# Patient Record
Sex: Male | Born: 1980 | Race: White | Hispanic: No | Marital: Married | Smoking: Never smoker
Health system: Southern US, Community
[De-identification: ages and names within clinical notes are randomized; demographics above are authoritative.]

## PROBLEM LIST (undated history)

## (undated) DIAGNOSIS — R062 Wheezing: Secondary | ICD-10-CM

## (undated) DIAGNOSIS — K219 Gastro-esophageal reflux disease without esophagitis: Secondary | ICD-10-CM

## (undated) DIAGNOSIS — M79602 Pain in left arm: Secondary | ICD-10-CM

## (undated) DIAGNOSIS — T7840XA Allergy, unspecified, initial encounter: Secondary | ICD-10-CM

## (undated) DIAGNOSIS — F419 Anxiety disorder, unspecified: Secondary | ICD-10-CM

## (undated) HISTORY — DX: Pain in left arm: M79.602

## (undated) HISTORY — DX: Wheezing: R06.2

## (undated) HISTORY — DX: Gastro-esophageal reflux disease without esophagitis: K21.9

## (undated) HISTORY — DX: Anxiety disorder, unspecified: F41.9

## (undated) HISTORY — DX: Allergy, unspecified, initial encounter: T78.40XA

## (undated) HISTORY — PX: NASAL SEPTUM SURGERY: SHX37

---

## 2005-09-23 ENCOUNTER — Emergency Department: Payer: Self-pay | Admitting: Internal Medicine

## 2005-10-07 ENCOUNTER — Emergency Department: Payer: Self-pay | Admitting: Emergency Medicine

## 2009-03-09 ENCOUNTER — Ambulatory Visit: Payer: Self-pay | Admitting: Otolaryngology

## 2015-03-23 ENCOUNTER — Encounter: Payer: Self-pay | Admitting: Family Medicine

## 2015-03-23 ENCOUNTER — Ambulatory Visit (INDEPENDENT_AMBULATORY_CARE_PROVIDER_SITE_OTHER): Payer: Managed Care, Other (non HMO) | Admitting: Family Medicine

## 2015-03-23 VITALS — BP 112/74 | HR 86 | Temp 98.1°F | Resp 18 | Ht 74.0 in | Wt 172.5 lb

## 2015-03-23 DIAGNOSIS — M25531 Pain in right wrist: Secondary | ICD-10-CM

## 2015-03-23 DIAGNOSIS — R062 Wheezing: Secondary | ICD-10-CM | POA: Diagnosis not present

## 2015-03-23 DIAGNOSIS — F429 Obsessive-compulsive disorder, unspecified: Secondary | ICD-10-CM

## 2015-03-23 DIAGNOSIS — K219 Gastro-esophageal reflux disease without esophagitis: Secondary | ICD-10-CM | POA: Diagnosis not present

## 2015-03-23 DIAGNOSIS — R1013 Epigastric pain: Secondary | ICD-10-CM

## 2015-03-23 DIAGNOSIS — R634 Abnormal weight loss: Secondary | ICD-10-CM

## 2015-03-23 DIAGNOSIS — Z23 Encounter for immunization: Secondary | ICD-10-CM | POA: Diagnosis not present

## 2015-03-23 DIAGNOSIS — M79641 Pain in right hand: Secondary | ICD-10-CM | POA: Diagnosis not present

## 2015-03-23 MED ORDER — OMEPRAZOLE 40 MG PO CPDR: 40.0000 mg | DELAYED_RELEASE_CAPSULE | Freq: Every day | ORAL | Status: DC

## 2015-03-23 MED ORDER — ALBUTEROL SULFATE HFA 108 (90 BASE) MCG/ACT IN AERS: 2.0000 | INHALATION_SPRAY | Freq: Four times a day (QID) | RESPIRATORY_TRACT | Status: DC | PRN

## 2015-03-23 MED ORDER — SERTRALINE HCL 50 MG PO TABS
50.0000 mg | ORAL_TABLET | Freq: Every day | ORAL | Status: DC
Start: 2015-03-23 — End: 2015-04-12

## 2015-03-23 NOTE — Progress Notes (Signed)
Name: Ronald Hale   MRN: [id number]    DOB: [birth year]   Date:03/23/2015       Progress Note  Subjective  Chief Complaint  Chief Complaint  Patient presents with  . Abdominal Pain    onset 2 months burning sensation sometimes after he eats, patient states comes and goes  . Hand Pain    onset 1 month right hand radiates to wrist, states cannot lift anything heavy and also having tingling and numbness    HPI  Epigastric pain : he has a long history of gerd, usually controlled with OTC Tums and avoiding sodas and changing his diet, however over the past couple of months, having recurrent epigastric pain, most days of the week, described as burning sensation, sometimes sharp. Pain does not wake him up at night, no blood in stools, no black stools. He denies nausea or vomiting. He has noticed some regurgitation and about 6-8 lbs weight loss over the past few months.   Right hand and Wrist pain: he was in a kick boxing class last week and he felt immediate pain after punching the bag. He had to stop activity. Pain has been constant, feeling stiff on his wrist and 4th and 5th finger are really sore. Difficulty picking up anything, cannot even use a knife to cut his food last night. Pain is described as aching, sharp and sometimes tingling. Pain at rest is about 5/10 but can go up to 10/10.   OCD: he states he had symptoms years ago, noticed when living with his grandmother, before he got married, but wife is worried because symptoms are getting progressively worse. Fidgety, unable to relax, pacing, afraid to go outside. Needs to wash hands constantly, also asks wife to shower before bed and wash her hands. He is a germophobe. Also afraid to go outdoors because of fear of getting exposed to poison oak.   Wheezing: sob and wheezing the first 15 minutes of activity. Usually gets worse after that. No cough, no chest tightness. No symptoms when not exercising.   Patient Active Problem List    Diagnosis Date Noted  . GERD without esophagitis 03/23/2015  . Wheezing 03/23/2015  . OCD (obsessive compulsive disorder) 03/23/2015  . Allergic rhinitis 08/23/2008    History reviewed. No pertinent past surgical history.  History reviewed. No pertinent family history.  Social History   Social History  . Marital Status: Single    Spouse Name: N/A  . Number of Children: N/A  . Years of Education: N/A   Occupational History  . Not on file.   Social History Main Topics  . Smoking status: Never Smoker   . Smokeless tobacco: Never Used  . Alcohol Use: No  . Drug Use: No  . Sexual Activity: Yes   Other Topics Concern  . Not on file   Social History Narrative  . No narrative on file     Current outpatient prescriptions:  .  omeprazole (PRILOSEC) 40 MG capsule, Take 1 capsule (40 mg total) by mouth daily., Disp: 30 capsule, Rfl: 0 .  sertraline (ZOLOFT) 50 MG tablet, Take 1 tablet (50 mg total) by mouth daily. Start with half pill for one week and after that increase to one pill daily, Disp: 30 tablet, Rfl: 3  No Known Allergies   ROS  Ten systems reviewed and is negative except as mentioned in HPI   Objective  Filed Vitals:   03/23/15 1055  BP: 112/74  Pulse: 86  Temp: 98.1 F (  36.7 C)  TempSrc: Oral  Resp: 18  Height: 6\' 2"  (1.88 m)  Weight: 172 lb 8 oz (78.245 kg)  SpO2: 97%    Body mass index is 22.14 kg/(m^2).  Physical Exam  Constitutional: Patient appears well-developed and well-nourished. No distress.  HEENT: head atraumatic, normocephalic, pupils equal and reactive to light, neck supple, throat within normal limits Cardiovascular: Normal rate, regular rhythm and normal heart sounds.  No murmur heard. No BLE edema. Pulmonary/Chest: Effort normal and breath sounds normal. No respiratory distress. Abdominal: Soft.  There is no tenderness. Psychiatric: Patient has a normal mood and affect. behavior is normal. Judgment and thought content  normal. Muscular skeletal: mild bruising over 5th and 4th MCP area, very tender during palpation Metacarpals 4th and 5th  and right wrist, difficulty with grip  PHQ2/9: Depression screen PHQ 2/9 03/23/2015  Decreased Interest 0  Down, Depressed, Hopeless 0  PHQ - 2 Score 0    Fall Risk: Fall Risk  03/23/2015  Falls in the past year? No    Functional Status Survey: Is the patient deaf or have difficulty hearing?: No Does the patient have difficulty seeing, even when wearing glasses/contacts?: No Does the patient have difficulty concentrating, remembering, or making decisions?: No Does the patient have difficulty walking or climbing stairs?: No Does the patient have difficulty dressing or bathing?: No Does the patient have difficulty doing errands alone such as visiting a doctor's office or shopping?: No    Assessment & Plan  1. Epigastric pain  - CBC with Differential/Platelet - H. pylori antibody, IgG  2. Needs flu shot  - Flu Vaccine QUAD 36+ mos PF IM (Fluarix & Fluzone Quad PF)  3. 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   4. GERD without esophagitis  - omeprazole (PRILOSEC) 40 MG capsule; Take 1 capsule (40 mg total) by mouth daily.  Dispense: 30 capsule; Refill: 0  5. Right wrist pain  - Ambulatory referral to Orthopedic Surgery  6. Right hand pain  - Ambulatory referral to Orthopedic Surgery  7. Weight loss  We will check labs and monitor for now  8. OCD (obsessive compulsive disorder)  Discussed counseling but he wants to hold off for now - sertraline (ZOLOFT) 50 MG tablet; Take 1 tablet (50 mg total) by mouth daily. Start with half pill for one week and after that increase to one pill daily  Dispense: 30 tablet; Refill: 3

## 2015-03-24 ENCOUNTER — Other Ambulatory Visit: Payer: Self-pay | Admitting: Family Medicine

## 2015-03-24 LAB — CBC WITH DIFFERENTIAL/PLATELET
BASOS ABS: 0 10*3/uL (ref 0.0–0.2)
Basos: 0 %
EOS (ABSOLUTE): 0 10*3/uL (ref 0.0–0.4)
Eos: 0 %
Hematocrit: 44.1 % (ref 37.5–51.0)
Hemoglobin: 15.2 g/dL (ref 12.6–17.7)
IMMATURE GRANULOCYTES: 0 %
Immature Grans (Abs): 0 10*3/uL (ref 0.0–0.1)
LYMPHS ABS: 1.6 10*3/uL (ref 0.7–3.1)
Lymphs: 29 %
MCH: 30.7 pg (ref 26.6–33.0)
MCHC: 34.5 g/dL (ref 31.5–35.7)
MCV: 89 fL (ref 79–97)
MONOS ABS: 0.4 10*3/uL (ref 0.1–0.9)
Monocytes: 8 %
NEUTROS PCT: 63 %
Neutrophils Absolute: 3.3 10*3/uL (ref 1.4–7.0)
PLATELETS: 199 10*3/uL (ref 150–379)
RBC: 4.95 x10E6/uL (ref 4.14–5.80)
RDW: 13.1 % (ref 12.3–15.4)
WBC: 5.3 10*3/uL (ref 3.4–10.8)

## 2015-03-24 LAB — H. PYLORI ANTIBODY, IGG: H PYLORI IGG: 3.1 U/mL — AB (ref 0.0–0.8)

## 2015-03-24 MED ORDER — AMOXICILL-CLARITHRO-LANSOPRAZ PO MISC: Freq: Two times a day (BID) | ORAL | Status: DC

## 2015-04-12 ENCOUNTER — Encounter: Payer: Self-pay | Admitting: Family Medicine

## 2015-04-12 ENCOUNTER — Ambulatory Visit (INDEPENDENT_AMBULATORY_CARE_PROVIDER_SITE_OTHER): Payer: Managed Care, Other (non HMO) | Admitting: Family Medicine

## 2015-04-12 VITALS — BP 122/82 | HR 81 | Temp 98.0°F | Resp 18 | Ht 74.0 in | Wt 166.7 lb

## 2015-04-12 DIAGNOSIS — B9681 Helicobacter pylori [H. pylori] as the cause of diseases classified elsewhere: Secondary | ICD-10-CM

## 2015-04-12 DIAGNOSIS — F321 Major depressive disorder, single episode, moderate: Secondary | ICD-10-CM | POA: Diagnosis not present

## 2015-04-12 DIAGNOSIS — R062 Wheezing: Secondary | ICD-10-CM

## 2015-04-12 DIAGNOSIS — F429 Obsessive-compulsive disorder, unspecified: Secondary | ICD-10-CM | POA: Diagnosis not present

## 2015-04-12 DIAGNOSIS — A048 Other specified bacterial intestinal infections: Secondary | ICD-10-CM

## 2015-04-12 MED ORDER — ALBUTEROL SULFATE HFA 108 (90 BASE) MCG/ACT IN AERS: 2.0000 | INHALATION_SPRAY | Freq: Four times a day (QID) | RESPIRATORY_TRACT | Status: DC | PRN

## 2015-04-12 NOTE — Progress Notes (Signed)
Name: Ronald Hale   MRN: [id number]    DOB: 11-21-1980   Date:04/12/2015       Progress Note  Subjective  Chief Complaint  Chief Complaint  Patient presents with  . Medication Management    1 month F/U  . OCD    Started patient on Zoloft last visit and was having bad side effects such as mood swings  . Abdominal Pain    Started patient on Omeprazole but symptoms are unchanged.   . Wheezing    Unchanged gets sob with exertion    HPI  H. Pylori infection  : he has a long history of gerd, usually controlled with OTC Tums and avoiding sodas and changing his diet, however over the past couple of months, having recurrent epigastric pain, most days of the week, described as burning sensation, sometimes sharp. Pain does not wake him up at night, no blood in stools, no black stools. He denies nausea or vomiting. He has noticed some regurgitation and about 20  lbs weight loss over the past few months. He was diagnosed with h. Pylori but has not gotten prescription filled yet.  Right hand and Wrist pain: he was in a kick boxing class, seen by ortho, he is wearing a brace and is also taking Meloxicam but advised to take nsaid's and take Tylenol at this time  OCD: he states he had symptoms years ago, noticed when living with his grandmother, before he got married, but wife is worried because symptoms are getting progressively worse. Fidgety, unable to relax, pacing, afraid to go outside. Needs to wash hands constantly, also asks wife to shower before bed and wash her hands. He is a germophobe. Also afraid to go outdoors because of fear of getting exposed to poison oak.   Wheezing: sob and wheezing the first 15 minutes of activity. Usually gets worse after that. No cough, no chest tightness. No symptoms when not exercising. Spirometry today was normal, but since it is related to activity we will add Proair and see how he does.   Major Depression: his wife found out he was cheating on her on Nov  23rd,2016 , with a co-worker. He ha lost weight since, very stressed. The relationship ended, but his wife has not forgiven him yet, they argue all the time now, go to bed late, unable to unwind and sleep. His ability to focus has decreased. He tried Zoloft but made him get very angry and agitated, he had to stop medication 3 days ago because it was getting so intense. He also states that he got a gun to his mouth to attempt suicide on the 23 rd of December. He denies suicidal thoughts. He locked up the shot gun in her garage. He does not have the garage door opener.   Patient Active Problem List   Diagnosis Date Noted  . H. pylori infection 04/12/2015  . Wheezing 03/23/2015  . OCD (obsessive compulsive disorder) 03/23/2015  . Allergic rhinitis 08/23/2008    History reviewed. No pertinent past surgical history.  History reviewed. No pertinent family history.  Social History   Social History  . Marital Status: Single    Spouse Name: N/A  . Number of Children: N/A  . Years of Education: N/A   Occupational History  . Not on file.   Social History Main Topics  . Smoking status: Never Smoker   . Smokeless tobacco: Never Used  . Alcohol Use: No  . Drug Use: No  . Sexual Activity:  Yes   Other Topics Concern  . Not on file   Social History Narrative     Current outpatient prescriptions:  .  amoxicillin-clarithromycin-lansoprazole (PREVPAC) combo pack, Take by mouth 2 (two) times daily. Follow package directions., Disp: 1 kit, Rfl: 0 .  albuterol (PROVENTIL HFA;VENTOLIN HFA) 108 (90 Base) MCG/ACT inhaler, Inhale 2 puffs into the lungs every 6 (six) hours as needed for wheezing or shortness of breath., Disp: 1 Inhaler, Rfl: 0  No Known Allergies   ROS  Constitutional: Negative for fever , positive for weight change.  Respiratory: Negative for cough and shortness of breath.   Cardiovascular: Negative for chest pain ( when very upset and with activity ) or palpitations.   Gastrointestinal: Negative for abdominal pain, no bowel changes.  Musculoskeletal: Negative for gait problem or joint swelling.  Skin: Negative for rash.  Neurological: Negative for dizziness or headache.  No other specific complaints in a complete review of systems (except as listed in HPI above).  Objective  Filed Vitals:   04/12/15 0903  BP: 122/82  Pulse: 81  Temp: 98 F (36.7 C)  TempSrc: Oral  Resp: 18  Height: '6\' 2"'$  (1.88 m)  Weight: 166 lb 11.2 oz (75.615 kg)  SpO2: 98%    Body mass index is 21.39 kg/(m^2).  Physical Exam  Constitutional: Patient appears well-developed and well-nourished. No distress.  HEENT: head atraumatic, normocephalic, pupils equal and reactive to light, neck supple, throat within normal limits Cardiovascular: Normal rate, regular rhythm and normal heart sounds.  No murmur heard. No BLE edema. Pulmonary/Chest: Effort normal and breath sounds normal. No respiratory distress. Abdominal: Soft.  There is tenderness during palpation of epigastric area  Psychiatric: Patient has a normal mood and affect. behavior is normal. Judgment and thought content normal.  Recent Results (from the past 2160 hour(s))  CBC with Differential/Platelet     Status: None   Collection Time: 03/23/15 12:54 PM  Result Value Ref Range   WBC 5.3 3.4 - 10.8 x10E3/uL   RBC 4.95 4.14 - 5.80 x10E6/uL   Hemoglobin 15.2 12.6 - 17.7 g/dL   Hematocrit 44.1 37.5 - 51.0 %   MCV 89 79 - 97 fL   MCH 30.7 26.6 - 33.0 pg   MCHC 34.5 31.5 - 35.7 g/dL   RDW 13.1 12.3 - 15.4 %   Platelets 199 150 - 379 x10E3/uL   Neutrophils 63 %   Lymphs 29 %   Monocytes 8 %   Eos 0 %   Basos 0 %   Neutrophils Absolute 3.3 1.4 - 7.0 x10E3/uL   Lymphocytes Absolute 1.6 0.7 - 3.1 x10E3/uL   Monocytes Absolute 0.4 0.1 - 0.9 x10E3/uL   EOS (ABSOLUTE) 0.0 0.0 - 0.4 x10E3/uL   Basophils Absolute 0.0 0.0 - 0.2 x10E3/uL   Immature Granulocytes 0 %   Immature Grans (Abs) 0.0 0.0 - 0.1 x10E3/uL  H.  pylori antibody, IgG     Status: Abnormal   Collection Time: 03/23/15 12:54 PM  Result Value Ref Range   H Pylori IgG 3.1 (H) 0.0 - 0.8 U/mL    Comment:                              Negative            <0.9  Indeterminate  0.9 - 1.0                              Positive            >1.0      PHQ2/9: Depression screen [facility] 2/9 03/23/2015  Decreased Interest 0  Down, Depressed, Hopeless 0  PHQ - 2 Score 0    Fall Risk: Fall Risk  03/23/2015  Falls in the past year? No    Assessment & Plan  1. H. pylori infection  Get rx filled at the pharmacy  2. OCD (obsessive compulsive disorder)  - Ambulatory referral to Psychiatry  3. Wheezing  Normal spirometry, first test without activity and second after activity and it improved, try Albuterol prior to activity  - albuterol (PROVENTIL HFA;VENTOLIN HFA) 108 (90 Base) MCG/ACT inhaler; Inhale 2 puffs into the lungs every 6 (six) hours as needed for wheezing or shortness of breath.  Dispense: 1 Inhaler; Refill: 0  4. Major depressive disorder, single episode, moderate ([place])  - Ambulatory referral to Psychiatry Gave the patient the suicide hot line

## 2015-04-12 NOTE — Addendum Note (Signed)
Addended by: Vonna Kotyk L on: 04/12/2015 10:03 AM   Modules accepted: Orders

## 2015-04-17 ENCOUNTER — Ambulatory Visit: Payer: Managed Care, Other (non HMO) | Admitting: Licensed Clinical Social Worker

## 2015-04-24 ENCOUNTER — Ambulatory Visit (INDEPENDENT_AMBULATORY_CARE_PROVIDER_SITE_OTHER): Payer: Managed Care, Other (non HMO) | Admitting: Licensed Clinical Social Worker

## 2015-04-24 DIAGNOSIS — F411 Generalized anxiety disorder: Secondary | ICD-10-CM | POA: Diagnosis not present

## 2015-04-24 NOTE — Progress Notes (Signed)
Comprehensive Clinical Assessment (CCA) Note  04/24/2015 Ronald Hale [id number]  Visit Diagnosis:      ICD-9-CM ICD-10-CM   1. GAD (generalized anxiety disorder) 300.02 F41.1       CCA Part One  Part One has been completed on paper by the patient.  (See scanned document in Chart Review)  CCA Part Two A  Intake/Chief Complaint:  CCA Intake With Chief Complaint CCA Part Two Date: 04/24/15 CCA Part Two Time: 36 Chief Complaint/Presenting Problem: referred by PCP Dr. Gwynneth Aliment; possible OCD Patients Currently Reported Symptoms/Problems: poor appetite, restless sleep, jittery, can't sit still, pacing, cleaning,  Individual's Strengths: putting things together, numbers, math, financial stuff, organized, responsible, problems solver, sense of humor  Mental Health Symptoms Depression:  Depression: Change in energy/activity, Increase/decrease in appetite, Irritability, Weight gain/loss  Mania:     Anxiety:   Anxiety: Difficulty concentrating, Fatigue, Irritability, Restlessness, Sleep, Tension, Worrying  Psychosis:  Psychosis: N/A  Trauma:  Trauma: N/A  Obsessions:  Obsessions: N/A  Compulsions:  Compulsions: N/A  Inattention:  Inattention: N/A  Hyperactivity/Impulsivity:  Hyperactivity/Impulsivity: N/A  Oppositional/Defiant Behaviors:  Oppositional/Defiant Behaviors: N/A  Borderline Personality:  Emotional Irregularity: N/A  Other Mood/Personality Symptoms:      Mental Status Exam Appearance and self-care  Stature:  Stature: Tall  Weight:  Weight: Underweight  Clothing:  Clothing: Casual  Grooming:  Grooming: Normal  Cosmetic use:  Cosmetic Use: Age appropriate  Posture/gait:  Posture/Gait: Normal  Motor activity:  Motor Activity: Not Remarkable  Sensorium  Attention:  Attention: Normal  Concentration:  Concentration: Normal  Orientation:  Orientation: X5  Recall/memory:  Recall/Memory: Normal  Affect and Mood  Affect:  Affect: Appropriate  Mood:  Mood: Anxious   Relating  Eye contact:  Eye Contact: Normal  Facial expression:  Facial Expression: Anxious  Attitude toward examiner:  Attitude Toward Examiner: Cooperative  Thought and Language  Speech flow: Speech Flow: Normal  Thought content:     Preoccupation:     Hallucinations:     Organization:     Transport planner of Knowledge:  Fund of Knowledge: Average  Intelligence:  Intelligence: Average  Abstraction:  Abstraction: Normal  Judgement:  Judgement: Normal  Reality Testing:  Reality Testing: Adequate  Insight:  Insight: Good  Decision Making:  Decision Making: Normal  Social Functioning  Social Maturity:  Social Maturity: Responsible  Social Judgement:  Social Judgement: Normal  Stress  Stressors:     Coping Ability:     Skill Deficits:     Supports:      Family and Psychosocial History: Family history Marital status: Married Number of Years Married: 4 What types of issues is patient dealing with in the relationship?: positive Are you sexually active?: Yes What is your sexual orientation?: heterosexual Does patient have children?: No  Childhood History:  Childhood History By whom was/is the patient raised?: Grandparents Additional childhood history information: Born in [place], describes childhood as : happy, no problems Description of patient's relationship with caregiver when they were a child: Grandmother:positive relationship; grandfather currently deceased positive relationship while growing up How were you disciplined when you got in trouble as a child/adolescent?: "I was rarely disciplined as a child." Does patient have siblings?: Yes Number of Siblings: 1 Description of patient's current relationship with siblings: no relationship Did patient suffer any verbal/emotional/physical/sexual abuse as a child?: No Did patient suffer from severe childhood neglect?: No Has patient ever been sexually abused/assaulted/raped as an adolescent or adult?: No Was the  patient ever  a victim of a crime or a disaster?: No Witnessed domestic violence?: No Has patient been effected by domestic violence as an adult?: No  CCA Part Two B  Employment/Work Situation: Employment / Work Copywriter, advertising Employment situation: Employed Where is patient currently employed?: [employer] How long has patient been employed?: 3 years Patient's job has been impacted by current illness: Yes Describe how patient's job has been impacted: Believes that his job is impacting his current life What is the longest time patient has a held a job?: 3 years Where was the patient employed at that time?: [employer] Has patient ever been in the [employer]?: No  Education: Education Last Grade Completed: 14 Name of [place]: [place] Did Teacher, adult education From [employer]?: Yes Did Physicist, medical?: Yes What Type of College Degree Do you Have?: [employer] Did You Attend Graduate School?: No What Was Your Major?: [employer] Did You Have An Individualized Education Program (IIEP): No Did You Have Any Difficulty At [employer]?: No  Religion: Religion/Spirituality Are You A Religious Person?: No  Leisure/Recreation: Leisure / Recreation Leisure and Hobbies: anything with sports; basketball  Exercise/Diet: Exercise/Diet Do You Exercise?: No Have You Gained or Lost A Significant Amount of Weight in the Past Six Months?: Yes-Lost Number of Pounds Lost?: 20 Do You Follow a Special Diet?: No Do You Have Any Trouble Sleeping?: No  CCA Part Two C  Alcohol/Drug Use: Alcohol / Drug Use Pain Medications: n/a Prescriptions: Prevpac Over the Counter: benadryl (allergies) History of alcohol / drug use?: No history of alcohol / drug abuse                      CCA Part Three  ASAM's:  Six Dimensions of Multidimensional Assessment  Dimension 1:  Acute Intoxication and/or Withdrawal Potential:     Dimension 2:  Biomedical Conditions and Complications:     Dimension 3:   Emotional, Behavioral, or Cognitive Conditions and Complications:     Dimension 4:  Readiness to Change:     Dimension 5:  Relapse, Continued use, or Continued Problem Potential:     Dimension 6:  Recovery/Living Environment:      Substance use Disorder (SUD)    Social Function:  Social Functioning Social Maturity: Responsible Social Judgement: Normal  Stress:     Risk Assessment- Self-Harm Potential: Risk Assessment For Self-Harm Potential Thoughts of Self-Harm: No current thoughts Method: No plan Availability of Means: No access/NA Additional Comments for Self-Harm Potential: about a month ago; one thought of suicide while on Zoloft.    Risk Assessment -Dangerous to Others Potential: Risk Assessment For Dangerous to Others Potential Method: No Plan Availability of Means: No access or NA Intent: Vague intent or NA Notification Required: No need or identified person  DSM5 Diagnoses: Patient Active Problem List   Diagnosis Date Noted  . H. pylori infection 04/12/2015  . Wheezing 03/23/2015  . OCD (obsessive compulsive disorder) 03/23/2015  . Allergic rhinitis 08/23/2008    Patient Centered Plan: Patient is on the following Treatment Plan(s):  Anxiety  Recommendations for Services/Supports/Treatments: Recommendations for Services/Supports/Treatments Recommendations For Services/Supports/Treatments: Medication Management, Individual Therapy  Treatment Plan Summary:    Referrals to Alternative Service(s): Referred to Alternative Service(s):   Place:   Date:   Time:    Referred to Alternative Service(s):   Place:   Date:   Time:    Referred to Alternative Service(s):   Place:   Date:   Time:    Referred to Alternative  Service(s):   Place:   Date:   Time:     [place]

## 2015-04-25 ENCOUNTER — Ambulatory Visit: Payer: Managed Care, Other (non HMO) | Admitting: Family Medicine

## 2015-05-15 ENCOUNTER — Ambulatory Visit (INDEPENDENT_AMBULATORY_CARE_PROVIDER_SITE_OTHER): Payer: Managed Care, Other (non HMO) | Admitting: Family Medicine

## 2015-05-15 ENCOUNTER — Encounter: Payer: Self-pay | Admitting: Family Medicine

## 2015-05-15 VITALS — BP 116/74 | HR 85 | Temp 98.1°F | Resp 16 | Ht 74.0 in | Wt 163.1 lb

## 2015-05-15 DIAGNOSIS — F429 Obsessive-compulsive disorder, unspecified: Secondary | ICD-10-CM | POA: Diagnosis not present

## 2015-05-15 DIAGNOSIS — F321 Major depressive disorder, single episode, moderate: Secondary | ICD-10-CM | POA: Diagnosis not present

## 2015-05-15 DIAGNOSIS — F411 Generalized anxiety disorder: Secondary | ICD-10-CM

## 2015-05-15 DIAGNOSIS — F325 Major depressive disorder, single episode, in full remission: Secondary | ICD-10-CM | POA: Insufficient documentation

## 2015-05-15 DIAGNOSIS — R062 Wheezing: Secondary | ICD-10-CM

## 2015-05-15 DIAGNOSIS — B9681 Helicobacter pylori [H. pylori] as the cause of diseases classified elsewhere: Secondary | ICD-10-CM | POA: Diagnosis not present

## 2015-05-15 DIAGNOSIS — A048 Other specified bacterial intestinal infections: Secondary | ICD-10-CM

## 2015-05-15 MED ORDER — BUSPIRONE HCL 7.5 MG PO TABS: 7.5000 mg | ORAL_TABLET | Freq: Three times a day (TID) | ORAL | Status: DC

## 2015-05-15 NOTE — Progress Notes (Signed)
Name: Ronald Hale   MRN: [id number]    DOB: 01/11/1981   Date:05/15/2015       Progress Note  Subjective  Chief Complaint  Chief Complaint  Patient presents with  . Follow-up    patient is here for a 22-month f/u    HPI  H. Pylori infection : treated in January with Prevpack , he had a lot of side effects, but finished therapy, epigastric pain is gone, still has some intermittent heartburn, but back to baseline. Off Omeprazole  Right hand and Wrist pain: he was in a kick boxing class, seen by ortho, he is wearing a brace and is also taking Meloxicam but advised to take nsaid's and take Tylenol at this time  OCD: he states he had symptoms years ago, noticed when living with his grandmother, before he got married, but wife is worried because symptoms are getting progressively worse. Fidgety, unable to relax, pacing, afraid to go outside. Needs to wash hands constantly, also asks wife to shower before bed and wash her hands. He is a germophobe. Also afraid to go outdoors because of fear of getting exposed to poison oak. They went to the first visit with therapist, and have follow up in March. He could not tolerate medication  Wheezing: sob and wheezing the first 15 minutes of activity. Usually gets worse after that. No cough, no chest tightness. No symptoms when not exercising. Spirometry today was normal, but since it is related to activity we will add Proair and see how he does.   Major Depression: his wife found out he was cheating on her on Nov 23rd,2016 , with a co-worker. He ha lost weight since, very stressed. The relationship ended, but his wife has not forgiven him yet, but they are doing better. Able to communicate.  His ability to focus has decreased. He tried Zoloft but made him get very angry and agitated, he had to stop medication because it was getting so intense. He also states that he got a gun to his mouth to attempt suicide on the 23 rd of December. He denies suicidal  thoughts. He is gradually getting better, still losing weight, but states appetite is better. He would like to try Buspar - his grandmother has given him some and it worked well for him. Explained the danger or using someone else's prescription  Insomnia :unable to fall and sometimes stay asleep, depending on stress level, he does not want medication for it at this time  Patient Active Problem List   Diagnosis Date Noted  . GAD (generalized anxiety disorder) 05/15/2015  . Major depressive disorder, single episode, moderate ([place]) 05/15/2015  . H. pylori infection 04/12/2015  . Wheezing 03/23/2015  . OCD (obsessive compulsive disorder) 03/23/2015  . Allergic rhinitis 08/23/2008    History reviewed. No pertinent past surgical history.  History reviewed. No pertinent family history.  Social History   Social History  . Marital Status: Single    Spouse Name: N/A  . Number of Children: N/A  . Years of Education: N/A   Occupational History  . Not on file.   Social History Main Topics  . Smoking status: Never Smoker   . Smokeless tobacco: Never Used  . Alcohol Use: No  . Drug Use: No  . Sexual Activity: Yes   Other Topics Concern  . Not on file   Social History Narrative     Current outpatient prescriptions:  .  busPIRone (BUSPAR) 7.5 MG tablet, Take 1 tablet (7.5 mg  total) by mouth 3 (three) times daily., Disp: 90 tablet, Rfl: 0  No Known Allergies   ROS  Ten systems reviewed and is negative except as mentioned in HPI   Objective  Filed Vitals:   05/15/15 1553  BP: 116/74  Pulse: 85  Temp: 98.1 F (36.7 C)  TempSrc: Oral  Resp: 16  Height: 6\' 2"  (1.88 m)  Weight: 163 lb 1.6 oz (73.982 kg)  SpO2: 97%    Body mass index is 20.93 kg/(m^2).  Physical Exam  Constitutional: Patient appears well-developed and thin with sunken eyes, dark circles under his eyes. No distress.  HEENT: head atraumatic, normocephalic, pupils equal and reactive to light,  neck  supple, throat within normal limits Cardiovascular: Normal rate, regular rhythm and normal heart sounds.  No murmur heard. No BLE edema. Pulmonary/Chest: Effort normal and breath sounds normal. No respiratory distress. Abdominal: Soft.  There is no tenderness. Psychiatric: Patient has a normal mood and affect. behavior is normal. Judgment and thought content normal.  Recent Results (from the past 2160 hour(s))  CBC with Differential/Platelet     Status: None   Collection Time: 03/23/15 12:54 PM  Result Value Ref Range   WBC 5.3 3.4 - 10.8 x10E3/uL   RBC 4.95 4.14 - 5.80 x10E6/uL   Hemoglobin 15.2 12.6 - 17.7 g/dL   Hematocrit 44.1 37.5 - 51.0 %   MCV 89 79 - 97 fL   MCH 30.7 26.6 - 33.0 pg   MCHC 34.5 31.5 - 35.7 g/dL   RDW 13.1 12.3 - 15.4 %   Platelets 199 150 - 379 x10E3/uL   Neutrophils 63 %   Lymphs 29 %   Monocytes 8 %   Eos 0 %   Basos 0 %   Neutrophils Absolute 3.3 1.4 - 7.0 x10E3/uL   Lymphocytes Absolute 1.6 0.7 - 3.1 x10E3/uL   Monocytes Absolute 0.4 0.1 - 0.9 x10E3/uL   EOS (ABSOLUTE) 0.0 0.0 - 0.4 x10E3/uL   Basophils Absolute 0.0 0.0 - 0.2 x10E3/uL   Immature Granulocytes 0 %   Immature Grans (Abs) 0.0 0.0 - 0.1 x10E3/uL  H. pylori antibody, IgG     Status: Abnormal   Collection Time: 03/23/15 12:54 PM  Result Value Ref Range   H Pylori IgG 3.1 (H) 0.0 - 0.8 U/mL    Comment:                              Negative            <0.9                              Indeterminate  0.9 - 1.0                              Positive            >1.0       PHQ2/9: Depression screen [facility] 2/9 05/15/2015 03/23/2015  Decreased Interest 0 0  Down, Depressed, Hopeless 0 0  PHQ - 2 Score 0 0     Fall Risk: Fall Risk  05/15/2015 03/23/2015  Falls in the past year? No No      Functional Status Survey: Is the patient deaf or have difficulty hearing?: No Does the patient have difficulty seeing, even when wearing glasses/contacts?: No Does the patient have difficulty  concentrating, remembering, or making decisions?: No Does the patient have difficulty walking or climbing stairs?: No Does the patient have difficulty dressing or bathing?: No Does the patient have difficulty doing errands alone such as visiting a doctor's office or shopping?: No   Assessment & Plan  1. H. pylori infection  - H. pylori breath test  2. Major depressive disorder, single episode, moderate (HCC)  - busPIRone (BUSPAR) 7.5 MG tablet; Take 1 tablet (7.5 mg total) by mouth 3 (three) times daily.  Dispense: 90 tablet; Refill: 0  3. OCD (obsessive compulsive disorder)   4. Wheezing  Continue prn inhaler  5. GAD (generalized anxiety disorder)  We will try Buspar, possible side effects discussed with patient and his wife - busPIRone (BUSPAR) 7.5 MG tablet; Take 1 tablet (7.5 mg total) by mouth 3 (three) times daily.  Dispense: 90 tablet; Refill: 0

## 2015-05-29 ENCOUNTER — Telehealth: Payer: Self-pay

## 2015-05-29 LAB — H. PYLORI BREATH TEST: H. pylori UBiT: NEGATIVE

## 2015-05-29 NOTE — Telephone Encounter (Signed)
-----   Message from Steele Sizer, MD sent at 05/29/2015  8:32 AM EST ----- He was successfully treated. H. Pylori negative

## 2015-05-29 NOTE — Telephone Encounter (Signed)
Left message for patient to return my call regarding lab results.

## 2015-06-12 ENCOUNTER — Ambulatory Visit (INDEPENDENT_AMBULATORY_CARE_PROVIDER_SITE_OTHER): Payer: Managed Care, Other (non HMO) | Admitting: Family Medicine

## 2015-06-12 ENCOUNTER — Encounter: Payer: Self-pay | Admitting: Family Medicine

## 2015-06-12 VITALS — BP 122/86 | HR 81 | Temp 98.1°F | Resp 14 | Wt 162.0 lb

## 2015-06-12 DIAGNOSIS — G4700 Insomnia, unspecified: Secondary | ICD-10-CM | POA: Diagnosis not present

## 2015-06-12 DIAGNOSIS — F321 Major depressive disorder, single episode, moderate: Secondary | ICD-10-CM | POA: Diagnosis not present

## 2015-06-12 DIAGNOSIS — R634 Abnormal weight loss: Secondary | ICD-10-CM

## 2015-06-12 DIAGNOSIS — F411 Generalized anxiety disorder: Secondary | ICD-10-CM

## 2015-06-12 DIAGNOSIS — R062 Wheezing: Secondary | ICD-10-CM

## 2015-06-12 DIAGNOSIS — F429 Obsessive-compulsive disorder, unspecified: Secondary | ICD-10-CM | POA: Diagnosis not present

## 2015-06-12 MED ORDER — TRAZODONE HCL 50 MG PO TABS: 25.0000 mg | ORAL_TABLET | Freq: Every evening | ORAL | Status: DC | PRN

## 2015-06-12 MED ORDER — BUSPIRONE HCL 7.5 MG PO TABS: 7.5000 mg | ORAL_TABLET | Freq: Three times a day (TID) | ORAL | Status: DC

## 2015-06-12 NOTE — Progress Notes (Signed)
Name: Ronald Hale   MRN: [id number]    DOB: 1981/03/03   Date:06/12/2015       Progress Note  Subjective  Chief Complaint  Chief Complaint  Patient presents with  . Follow-up    1 month   . Depression    started on buspar last visit    HPI  H. Pylori infection : treated in January with Prevpack , he had a lot of side effects, but finished therapy, epigastric pain is gone, still has some intermittent heartburn, but back to baseline. Off Omeprazole. Urea breath test negative  Right hand and Wrist pain: he was in a kick boxing class, seen by ortho, he is wearing a brace and is also taking Meloxicam but advised to take nsaid's and take Tylenol at this time. Doing better  OCD: he states he had symptoms years ago, noticed when living with his grandmother, before he got married, but wife is worried because symptoms are getting progressively worse. Fidgety, unable to relax, pacing, afraid to go outside. Needs to wash hands constantly, also asks wife to shower before bed and wash her hands. He is a germophobe. Also afraid to go outdoors because of fear of getting exposed to poison oak. They went to the first visit with therapist, and have follow up in March. He could not tolerate medication  Wheezing: sob and wheezing the first 15 minutes of activity. Usually gets worse after that, however currently not having symptoms. No cough, no chest tightness. No symptoms when not exercising. Spirometry today was normal, he is not using Proair  Major Depression: his wife found out he was cheating on her on Nov 23rd,2016 , with a co-worker. He ha lost weight since, very stressed. The relationship ended, but his wife has not forgiven him yet, but they are doing better. Able to communicate. His ability to focus has improved. He tried Zoloft but made him get very angry and agitated, he had to stop medication because it was getting so intense. He also states that he got a gun to his mouth to attempt suicide on  the 23 rd of December. He denies suicidal thoughts. He is gradually getting better, weight is stable, but states appetite is better. He is on Buspar twice daily, advised to take it three times daily.   Insomnia :unable to fall and sometimes stay asleep, depending on stress level, he is willing to try medication at this time  Patient Active Problem List   Diagnosis Date Noted  . GAD (generalized anxiety disorder) 05/15/2015  . Major depressive disorder, single episode, moderate ([place]) 05/15/2015  . H. pylori infection 04/12/2015  . OCD (obsessive compulsive disorder) 03/23/2015  . Allergic rhinitis 08/23/2008    History reviewed. No pertinent past surgical history.  History reviewed. No pertinent family history.  Social History   Social History  . Marital Status: Married    Spouse Name: Araceli  . Number of Children: 0  . Years of Education: N/A   Occupational History  . Not on file.   Social History Main Topics  . Smoking status: Never Smoker   . Smokeless tobacco: Never Used  . Alcohol Use: No  . Drug Use: No  . Sexual Activity:    Partners: Female   Other Topics Concern  . Not on file   Social History Narrative   Raised by grandparents since birth, no contact with parents, not much of family history available      Current outpatient prescriptions:  .  busPIRone (  BUSPAR) 7.5 MG tablet, Take 1 tablet (7.5 mg total) by mouth 3 (three) times daily., Disp: 90 tablet, Rfl: 1  No Known Allergies   ROS  Constitutional: Negative for fever or weight change.  Respiratory: Negative for cough and shortness of breath.   Cardiovascular: Negative for chest pain or palpitations.  Gastrointestinal: Negative for abdominal pain, no bowel changes.  Musculoskeletal: Negative for gait problem or joint swelling.  Skin: Negative for rash.  Neurological: Negative for dizziness or headache.  No other specific complaints in a complete review of systems (except as listed in HPI  above).  Objective  Filed Vitals:   06/12/15 1514  BP: 122/86  Pulse: 81  Temp: 98.1 F (36.7 C)  TempSrc: Oral  Resp: 14  Weight: 162 lb (73.483 kg)  SpO2: 98%    Body mass index is 20.79 kg/(m^2).  Physical Exam  Constitutional: Patient appears well-developed and well-nourished. No distress.  HEENT: head atraumatic, normocephalic, pupils equal and reactive to light, neck supple, throat within normal limits Cardiovascular: Normal rate, regular rhythm and normal heart sounds.  No murmur heard. No BLE edema. Pulmonary/Chest: Effort normal and breath sounds normal. No respiratory distress. Abdominal: Soft.  There is no tenderness. Psychiatric: Patient has a normal mood and affect. behavior is normal. Judgment and thought content normal.  Recent Results (from the past 2160 hour(s))  CBC with Differential/Platelet     Status: None   Collection Time: 03/23/15 12:54 PM  Result Value Ref Range   WBC 5.3 3.4 - 10.8 x10E3/uL   RBC 4.95 4.14 - 5.80 x10E6/uL   Hemoglobin 15.2 12.6 - 17.7 g/dL   Hematocrit 44.1 37.5 - 51.0 %   MCV 89 79 - 97 fL   MCH 30.7 26.6 - 33.0 pg   MCHC 34.5 31.5 - 35.7 g/dL   RDW 13.1 12.3 - 15.4 %   Platelets 199 150 - 379 x10E3/uL   Neutrophils 63 %   Lymphs 29 %   Monocytes 8 %   Eos 0 %   Basos 0 %   Neutrophils Absolute 3.3 1.4 - 7.0 x10E3/uL   Lymphocytes Absolute 1.6 0.7 - 3.1 x10E3/uL   Monocytes Absolute 0.4 0.1 - 0.9 x10E3/uL   EOS (ABSOLUTE) 0.0 0.0 - 0.4 x10E3/uL   Basophils Absolute 0.0 0.0 - 0.2 x10E3/uL   Immature Granulocytes 0 %   Immature Grans (Abs) 0.0 0.0 - 0.1 x10E3/uL  H. pylori antibody, IgG     Status: Abnormal   Collection Time: 03/23/15 12:54 PM  Result Value Ref Range   H Pylori IgG 3.1 (H) 0.0 - 0.8 U/mL    Comment:                              Negative            <0.9                              Indeterminate  0.9 - 1.0                              Positive            >1.0   H. pylori breath test     Status: None    Collection Time: 05/23/15  3:43 PM  Result Value Ref Range   H. pylori UBiT  Negative Negative     PHQ2/9: Depression screen [facility] 2/9 05/15/2015 03/23/2015  Decreased Interest 0 0  Down, Depressed, Hopeless 0 0  PHQ - 2 Score 0 0    Fall Risk: Fall Risk  05/15/2015 03/23/2015  Falls in the past year? No No    Assessment & Plan  1. Major depressive disorder, single episode, moderate (HCC)  - busPIRone (BUSPAR) 7.5 MG tablet; Take 1 tablet (7.5 mg total) by mouth 3 (three) times daily.  Dispense: 90 tablet; Refill: 1  2. OCD (obsessive compulsive disorder)  Discussed individual counseling again  3. Weight loss  - Comprehensive metabolic panel - HIV antibody - TSH  4. GAD (generalized anxiety disorder)  - busPIRone (BUSPAR) 7.5 MG tablet; Take 1 tablet (7.5 mg total) by mouth 3 (three) times daily.  Dispense: 90 tablet; Refill: 1  5. Wheezing  Normal spirometry , symptoms resolved.   6. Insomnia  - traZODone (DESYREL) 50 MG tablet; Take 0.5-1 tablets (25-50 mg total) by mouth at bedtime as needed for sleep.  Dispense: 30 tablet; Refill: 1

## 2015-06-19 ENCOUNTER — Ambulatory Visit: Payer: Managed Care, Other (non HMO) | Admitting: Psychiatry

## 2015-07-04 LAB — COMPREHENSIVE METABOLIC PANEL
ALBUMIN: 4.3 g/dL (ref 3.5–5.5)
ALK PHOS: 71 IU/L (ref 39–117)
ALT: 73 IU/L — ABNORMAL HIGH (ref 0–44)
AST: 176 IU/L — ABNORMAL HIGH (ref 0–40)
Albumin/Globulin Ratio: 1.8 (ref 1.2–2.2)
BILIRUBIN TOTAL: 0.7 mg/dL (ref 0.0–1.2)
BUN / CREAT RATIO: 16 (ref 8–19)
BUN: 13 mg/dL (ref 6–20)
CHLORIDE: 101 mmol/L (ref 96–106)
CO2: 26 mmol/L (ref 18–29)
CREATININE: 0.82 mg/dL (ref 0.76–1.27)
Calcium: 8.8 mg/dL (ref 8.7–10.2)
GFR calc Af Amer: 134 mL/min/{1.73_m2} (ref >59)
GFR calc non Af Amer: 116 mL/min/{1.73_m2} (ref >59)
GLOBULIN, TOTAL: 2.4 g/dL (ref 1.5–4.5)
Glucose: 81 mg/dL (ref 65–99)
Potassium: 4.5 mmol/L (ref 3.5–5.2)
SODIUM: 142 mmol/L (ref 134–144)
Total Protein: 6.7 g/dL (ref 6.0–8.5)

## 2015-07-04 LAB — HIV ANTIBODY (ROUTINE TESTING W REFLEX): HIV SCREEN 4TH GENERATION: NONREACTIVE

## 2015-07-04 LAB — TSH: TSH: 1.92 u[IU]/mL (ref 0.450–4.500)

## 2015-08-14 ENCOUNTER — Ambulatory Visit: Payer: Self-pay | Admitting: Family Medicine

## 2015-12-29 ENCOUNTER — Ambulatory Visit (INDEPENDENT_AMBULATORY_CARE_PROVIDER_SITE_OTHER): Payer: Managed Care, Other (non HMO) | Admitting: Family Medicine

## 2015-12-29 ENCOUNTER — Encounter: Payer: Self-pay | Admitting: Family Medicine

## 2015-12-29 VITALS — BP 110/62 | HR 82 | Temp 98.0°F | Resp 18 | Wt 153.2 lb

## 2015-12-29 DIAGNOSIS — R634 Abnormal weight loss: Secondary | ICD-10-CM | POA: Diagnosis not present

## 2015-12-29 DIAGNOSIS — Z23 Encounter for immunization: Secondary | ICD-10-CM | POA: Diagnosis not present

## 2015-12-29 DIAGNOSIS — G4700 Insomnia, unspecified: Secondary | ICD-10-CM

## 2015-12-29 DIAGNOSIS — F429 Obsessive-compulsive disorder, unspecified: Secondary | ICD-10-CM | POA: Diagnosis not present

## 2015-12-29 DIAGNOSIS — R7989 Other specified abnormal findings of blood chemistry: Secondary | ICD-10-CM | POA: Diagnosis not present

## 2015-12-29 DIAGNOSIS — F411 Generalized anxiety disorder: Secondary | ICD-10-CM | POA: Diagnosis not present

## 2015-12-29 DIAGNOSIS — F321 Major depressive disorder, single episode, moderate: Secondary | ICD-10-CM

## 2015-12-29 DIAGNOSIS — R945 Abnormal results of liver function studies: Secondary | ICD-10-CM

## 2015-12-29 LAB — CBC WITH DIFFERENTIAL/PLATELET
BASOS ABS: 0 {cells}/uL (ref 0–200)
Basophils Relative: 0 %
EOS PCT: 2 %
Eosinophils Absolute: 118 cells/uL (ref 15–500)
HCT: 44.2 % (ref 38.5–50.0)
HEMOGLOBIN: 15.2 g/dL (ref 13.2–17.1)
LYMPHS ABS: 1711 {cells}/uL (ref 850–3900)
LYMPHS PCT: 29 %
MCH: 30.6 pg (ref 27.0–33.0)
MCHC: 34.4 g/dL (ref 32.0–36.0)
MCV: 89.1 fL (ref 80.0–100.0)
MPV: 9.5 fL (ref 7.5–12.5)
Monocytes Absolute: 413 cells/uL (ref 200–950)
Monocytes Relative: 7 %
NEUTROS PCT: 62 %
Neutro Abs: 3658 cells/uL (ref 1500–7800)
Platelets: 189 10*3/uL (ref 140–400)
RBC: 4.96 MIL/uL (ref 4.20–5.80)
RDW: 12.8 % (ref 11.0–15.0)
WBC: 5.9 10*3/uL (ref 3.8–10.8)

## 2015-12-29 LAB — COMPLETE METABOLIC PANEL WITH GFR
ALBUMIN: 4.7 g/dL (ref 3.6–5.1)
ALK PHOS: 50 U/L (ref 40–115)
ALT: 10 U/L (ref 9–46)
AST: 15 U/L (ref 10–40)
BUN: 14 mg/dL (ref 7–25)
CALCIUM: 9.6 mg/dL (ref 8.6–10.3)
CO2: 31 mmol/L (ref 20–31)
Chloride: 103 mmol/L (ref 98–110)
Creat: 1.05 mg/dL (ref 0.60–1.35)
GFR, Est African American: >89 mL/min (ref >=60)
GLUCOSE: 87 mg/dL (ref 65–99)
POTASSIUM: 4.5 mmol/L (ref 3.5–5.3)
SODIUM: 141 mmol/L (ref 135–146)
Total Bilirubin: 0.9 mg/dL (ref 0.2–1.2)
Total Protein: 7.2 g/dL (ref 6.1–8.1)

## 2015-12-29 MED ORDER — BUSPIRONE HCL 7.5 MG PO TABS: 7.5000 mg | ORAL_TABLET | Freq: Three times a day (TID) | ORAL | 1 refills | Status: DC

## 2015-12-29 NOTE — Progress Notes (Signed)
Name: Ronald Hale   MRN: [id number]    DOB: 10/24/1980   Date:12/29/2015       Progress Note  Subjective  Chief Complaint  Chief Complaint  Patient presents with  . Medication Refill    Anxiety Medication     HPI  OCD/GAD: present since his 20's when he lived with his grandmother. Symptoms got worse over the past 10 years, but amplified when he was having marital problems, he was unable to relax, pacing, afraid to go outside. Needs to wash hands constantly, also asks wife to shower before bed and wash her hands. He is still  a germophobe. Also afraid to go outdoors because of fear of getting exposed to poison oak. He went to a few therapy session and states Buspirone seems to help him feel more centered. Wife has noticed that he is doing better with medication   Major Depression: his wife found out he was cheating on her on Nov 23rd,2016 , with a co-worker. He ha lost weight since, very stressed. The affair ended and too a long time for his wife to forgive him but they are in a good place now. Able to communicate. His ability to focus has improved. He tried Zoloft but made him get very angry and agitated, he had to stop medication because it was getting so intense. He also states that he got a gun to his mouth to attempt suicide on the 23 rd of December 2016. He denies suicidal thoughts. He is gradually getting better,appetite is good but unable to gain weight. He is on Buspar twice daily  Weight loss: initially secondary to stress and h. Pylori infection, he states eating well, no abdominal pain, no change in bowel movements, feels well, but started a new job in April 2017 and is a physical job ( works in a warehouse - shipping and receiving ) and lost over 10 lbs since last visit    Patient Active Problem List   Diagnosis Date Noted  . Insomnia 06/12/2015  . GAD (generalized anxiety disorder) 05/15/2015  . Major depression in remission ([place]) 05/15/2015  . H. pylori infection  04/12/2015  . OCD (obsessive compulsive disorder) 03/23/2015  . Allergic rhinitis 08/23/2008    History reviewed. No pertinent surgical history.  History reviewed. No pertinent family history.  Social History   Social History  . Marital status: Married    Spouse name: Araceli  . Number of children: 0  . Years of education: N/A   Occupational History  . Not on file.   Social History Main Topics  . Smoking status: Never Smoker  . Smokeless tobacco: Never Used  . Alcohol use No  . Drug use: No  . Sexual activity: Yes    Partners: Female   Other Topics Concern  . Not on file   Social History Narrative   Raised by grandparents since birth, no contact with parents, not much of family history available      Current Outpatient Prescriptions:  .  busPIRone (BUSPAR) 7.5 MG tablet, Take 1 tablet (7.5 mg total) by mouth 3 (three) times daily., Disp: 90 tablet, Rfl: 1 .  traZODone (DESYREL) 50 MG tablet, Take 0.5-1 tablets (25-50 mg total) by mouth at bedtime as needed for sleep., Disp: 30 tablet, Rfl: 1  No Known Allergies   ROS  Constitutional: Negative for fever, positive weight change.  Respiratory: Negative for cough and shortness of breath.   Cardiovascular: Negative for chest pain or palpitations.  Gastrointestinal: Negative  for abdominal pain, no bowel changes.  Musculoskeletal: Negative for gait problem or joint swelling.  Skin: Negative for rash.  Neurological: Negative for dizziness or headache.  No other specific complaints in a complete review of systems (except as listed in HPI above).  Objective  Vitals:   12/29/15 1514  BP: 110/62  Pulse: 82  Resp: 18  Temp: 98 F (36.7 C)  TempSrc: Oral  SpO2: 97%  Weight: 153 lb 3 oz (69.5 kg)    Body mass index is 19.67 kg/m.  Physical Exam  Constitutional: Patient appears well-developed and well-nourished.  No distress.  HEENT: head atraumatic, normocephalic, pupils equal and reactive to light, neck  supple, throat within normal limits Cardiovascular: Normal rate, regular rhythm and normal heart sounds.  No murmur heard. No BLE edema. Pulmonary/Chest: Effort normal and breath sounds normal. No respiratory distress. Abdominal: Soft.  There is no tenderness. Psychiatric: Patient has a normal mood and affect. behavior is normal. Judgment and thought content normal.  PHQ2/9: Depression screen [facility] 2/9 12/29/2015 05/15/2015 03/23/2015  Decreased Interest 0 0 0  Down, Depressed, Hopeless 0 0 0  PHQ - 2 Score 0 0 0     Fall Risk: Fall Risk  12/29/2015 05/15/2015 03/23/2015  Falls in the past year? No No No      Functional Status Survey: Is the patient deaf or have difficulty hearing?: No Does the patient have difficulty seeing, even when wearing glasses/contacts?: No Does the patient have difficulty concentrating, remembering, or making decisions?: No Does the patient have difficulty walking or climbing stairs?: No Does the patient have difficulty dressing or bathing?: No Does the patient have difficulty doing errands alone such as visiting a doctor's office or shopping?: No    Assessment & Plan  1. OCD (obsessive compulsive disorder)   2. GAD (generalized anxiety disorder)  - busPIRone (BUSPAR) 7.5 MG tablet; Take 1 tablet (7.5 mg total) by mouth 3 (three) times daily.  Dispense: 90 tablet; Refill: 1  3. Insomnia  Working 3rd shift, and sleeps 7-8 hours during the day, but in two sessions  4. Weight loss  - CBC with Differential/Platelet - COMPLETE METABOLIC PANEL WITH GFR - VITAMIN D 25 Hydroxy (Vit-D Deficiency, Fractures) - Vitamin B12 - Thyroid Panel With TSH - Sedimentation rate - C-reactive protein  5. Needs flu shot  Refuses shot today   6. Abnormal LFTs- CO  MPLETE METABOLIC PANEL WITH GFR  7. Major depressive disorder, single episode, moderate (HCC)  In remission  - busPIRone (BUSPAR) 7.5 MG tablet; Take 1 tablet (7.5 mg total) by mouth 3 (three) times  daily.  Dispense: 90 tablet; Refill: 1

## 2015-12-30 LAB — THYROID PANEL WITH TSH
Free Thyroxine Index: 2.5 (ref 1.4–3.8)
T3 UPTAKE: 33 % (ref 22–35)
T4 TOTAL: 7.7 ug/dL (ref 4.5–12.0)
TSH: 1.25 mIU/L (ref 0.40–4.50)

## 2015-12-30 LAB — VITAMIN D 25 HYDROXY (VIT D DEFICIENCY, FRACTURES): VIT D 25 HYDROXY: 38 ng/mL (ref 30–100)

## 2015-12-30 LAB — VITAMIN B12: Vitamin B-12: 256 pg/mL (ref 200–1100)

## 2015-12-30 LAB — C-REACTIVE PROTEIN: CRP: 1.8 mg/L (ref <8.0)

## 2015-12-30 LAB — SEDIMENTATION RATE: Sed Rate: 1 mm/hr (ref 0–15)

## 2016-01-08 ENCOUNTER — Encounter: Payer: Self-pay | Admitting: Family Medicine

## 2016-01-08 ENCOUNTER — Ambulatory Visit (INDEPENDENT_AMBULATORY_CARE_PROVIDER_SITE_OTHER): Payer: Managed Care, Other (non HMO) | Admitting: Family Medicine

## 2016-01-08 VITALS — BP 114/68 | HR 64 | Temp 97.8°F | Resp 16 | Ht 74.0 in | Wt 154.6 lb

## 2016-01-08 DIAGNOSIS — J3489 Other specified disorders of nose and nasal sinuses: Secondary | ICD-10-CM

## 2016-01-08 DIAGNOSIS — J302 Other seasonal allergic rhinitis: Secondary | ICD-10-CM

## 2016-01-08 DIAGNOSIS — J3089 Other allergic rhinitis: Secondary | ICD-10-CM

## 2016-01-08 MED ORDER — FLUTICASONE PROPIONATE 50 MCG/ACT NA SUSP: 2.0000 | Freq: Every day | NASAL | 6 refills | Status: DC

## 2016-01-08 MED ORDER — LEVOCETIRIZINE DIHYDROCHLORIDE 5 MG PO TABS: 5.0000 mg | ORAL_TABLET | Freq: Every evening | ORAL | 2 refills | Status: DC

## 2016-01-08 MED ORDER — AMOXICILLIN-POT CLAVULANATE 875-125 MG PO TABS: 1.0000 | ORAL_TABLET | Freq: Two times a day (BID) | ORAL | 0 refills | Status: DC

## 2016-01-08 NOTE — Progress Notes (Signed)
Name: Ronald Hale   MRN: [id number]    DOB: 1980-04-16   Date:01/08/2016       Progress Note  Subjective  Chief Complaint  Chief Complaint  Patient presents with  . Sinusitis    HPI  Allergic rhinitis/sinus pressure: he states that over the past 10 days he has noticed worsening of symptoms. Nasal congestion, post-nasal drainage, sneezing, clear rhinorrhea, but when he blows his nose it is green. He states over the past day he noticed frontal headache and worsening of facial pressure. No fever or chills. Appetite is normal. Taking Benadryl and allegra otc.He states he has a history of recurrent sinusitis. Discussed risk of antibiotic use and advised to try xyzal and flonase first.   Patient Active Problem List   Diagnosis Date Noted  . Insomnia 06/12/2015  . GAD (generalized anxiety disorder) 05/15/2015  . Major depression in remission ([place]) 05/15/2015  . H. pylori infection 04/12/2015  . OCD (obsessive compulsive disorder) 03/23/2015  . Perennial allergic rhinitis with seasonal variation 08/23/2008    No past surgical history on file.  No family history on file.  Social History   Social History  . Marital status: Married    Spouse name: Araceli  . Number of children: 0  . Years of education: N/A   Occupational History  . Not on file.   Social History Main Topics  . Smoking status: Never Smoker  . Smokeless tobacco: Never Used  . Alcohol use No  . Drug use: No  . Sexual activity: Yes    Partners: Female   Other Topics Concern  . Not on file   Social History Narrative   Raised by grandparents since birth, no contact with parents, not much of family history available      Current Outpatient Prescriptions:  .  amoxicillin-clavulanate (AUGMENTIN) 875-125 MG tablet, Take 1 tablet by mouth 2 (two) times daily., Disp: 28 tablet, Rfl: 0 .  busPIRone (BUSPAR) 7.5 MG tablet, Take 1 tablet (7.5 mg total) by mouth 3 (three) times daily., Disp: 90 tablet, Rfl: 1 .   fluticasone (FLONASE) 50 MCG/ACT nasal spray, Place 2 sprays into both nostrils daily., Disp: 16 g, Rfl: 6 .  levocetirizine (XYZAL) 5 MG tablet, Take 1 tablet (5 mg total) by mouth every evening., Disp: 30 tablet, Rfl: 2  No Known Allergies   ROS  Ten systems reviewed and is negative except as mentioned in HPI   Objective  Vitals:   01/08/16 1126  BP: 114/68  Pulse: 64  Resp: 16  Temp: 97.8 F (36.6 C)  SpO2: 99%  Weight: 154 lb 9 oz (70.1 kg)  Height: '6\' 2"'$  (1.88 m)    Body mass index is 19.84 kg/m.  Physical Exam  Constitutional: Patient appears well-developed and well-nourished.  No distress.  HEENT: head atraumatic, normocephalic, pupils equal and reactive to light, ear normal exam, neck supple, throat within normal limits. Tender during palpation of maxillary sinus Cardiovascular: Normal rate, regular rhythm and normal heart sounds.  No murmur heard. No BLE edema. Pulmonary/Chest: Effort normal and breath sounds normal. No respiratory distress. Abdominal: Soft.  There is no tenderness. Psychiatric: Patient has a normal mood and affect. behavior is normal. Judgment and thought content normal.  Recent Results (from the past 2160 hour(s))  CBC with Differential/Platelet     Status: None   Collection Time: 12/29/15  3:59 PM  Result Value Ref Range   WBC 5.9 3.8 - 10.8 K/uL   RBC 4.96 4.20 -  5.80 MIL/uL   Hemoglobin 15.2 13.2 - 17.1 g/dL   HCT 59.5 39.6 - 72.8 %   MCV 89.1 80.0 - 100.0 fL   MCH 30.6 27.0 - 33.0 pg   MCHC 34.4 32.0 - 36.0 g/dL   RDW 97.9 15.0 - 41.3 %   Platelets 189 140 - 400 K/uL   MPV 9.5 7.5 - 12.5 fL   Neutro Abs 3,658 1,500 - 7,800 cells/uL   Lymphs Abs 1,711 850 - 3,900 cells/uL   Monocytes Absolute 413 200 - 950 cells/uL   Eosinophils Absolute 118 15 - 500 cells/uL   Basophils Absolute 0 0 - 200 cells/uL   Neutrophils Relative % 62 %   Lymphocytes Relative 29 %   Monocytes Relative 7 %   Eosinophils Relative 2 %   Basophils Relative  0 %   Smear Review Criteria for review not met   COMPLETE METABOLIC PANEL WITH GFR     Status: None   Collection Time: 12/29/15  3:59 PM  Result Value Ref Range   Sodium 141 135 - 146 mmol/L   Potassium 4.5 3.5 - 5.3 mmol/L   Chloride 103 98 - 110 mmol/L   CO2 31 20 - 31 mmol/L   Glucose, Bld 87 65 - 99 mg/dL   BUN 14 7 - 25 mg/dL   Creat 6.43 8.37 - 7.93 mg/dL   Total Bilirubin 0.9 0.2 - 1.2 mg/dL   Alkaline Phosphatase 50 40 - 115 U/L   AST 15 10 - 40 U/L   ALT 10 9 - 46 U/L   Total Protein 7.2 6.1 - 8.1 g/dL   Albumin 4.7 3.6 - 5.1 g/dL   Calcium 9.6 8.6 - 96.8 mg/dL   GFR, Est African American >89 >=60 mL/min   GFR, Est Non African American >89 >=60 mL/min  VITAMIN D 25 Hydroxy (Vit-D Deficiency, Fractures)     Status: None   Collection Time: 12/29/15  3:59 PM  Result Value Ref Range   Vit D, 25-Hydroxy 38 30 - 100 ng/mL    Comment: Vitamin D Status           25-OH Vitamin D        Deficiency                <20 ng/mL        Insufficiency         20 - 29 ng/mL        Optimal             > or = 30 ng/mL   For 25-OH Vitamin D testing on patients on D2-supplementation and patients for whom quantitation of D2 and D3 fractions is required, the QuestAssureD 25-OH VIT D, (D2,D3), LC/MS/MS is recommended: order code 86484 (patients > 2 yrs).   Vitamin B12     Status: None   Collection Time: 12/29/15  3:59 PM  Result Value Ref Range   Vitamin B-12 256 200 - 1,100 pg/mL  Thyroid Panel With TSH     Status: None   Collection Time: 12/29/15  3:59 PM  Result Value Ref Range   T4, Total 7.7 4.5 - 12.0 ug/dL   T3 Uptake 33 22 - 35 %   Free Thyroxine Index 2.5 1.4 - 3.8   TSH 1.25 0.40 - 4.50 mIU/L  Sedimentation rate     Status: None   Collection Time: 12/29/15  3:59 PM  Result Value Ref Range   Sed Rate 1 0 - 15  mm/hr  C-reactive protein     Status: None   Collection Time: 12/29/15  3:59 PM  Result Value Ref Range   CRP 1.8 <8.0 mg/L    Comment: ** Please note change in  reference range(s). **         PHQ2/9: Depression screen [facility] 2/9 01/08/2016 12/29/2015 05/15/2015 03/23/2015  Decreased Interest 0 0 0 0  Down, Depressed, Hopeless 0 0 0 0  PHQ - 2 Score 0 0 0 0     Fall Risk: Fall Risk  01/08/2016 12/29/2015 05/15/2015 03/23/2015  Falls in the past year? No No No No     Functional Status Survey: Is the patient deaf or have difficulty hearing?: No Does the patient have difficulty seeing, even when wearing glasses/contacts?: No Does the patient have difficulty concentrating, remembering, or making decisions?: No Does the patient have difficulty walking or climbing stairs?: No Does the patient have difficulty dressing or bathing?: No Does the patient have difficulty doing errands alone such as visiting a doctor's office or shopping?: No    Assessment & Plan  1. Perennial allergic rhinitis with seasonal variation  Symptoms likely secondary to allergic rhinitis, stop allegra and benadryl  - fluticasone (FLONASE) 50 MCG/ACT nasal spray; Place 2 sprays into both nostrils daily.  Dispense: 16 g; Refill: 6 - levocetirizine (XYZAL) 5 MG tablet; Take 1 tablet (5 mg total) by mouth every evening.  Dispense: 30 tablet; Refill: 2  2. Sinus pressure  Hold off on prescription  - amoxicillin-clavulanate (AUGMENTIN) 875-125 MG tablet; Take 1 tablet by mouth 2 (two) times daily.  Dispense: 28 tablet; Refill: 0

## 2016-06-28 ENCOUNTER — Ambulatory Visit: Payer: Self-pay | Admitting: Family Medicine

## 2017-03-13 DIAGNOSIS — I83811 Varicose veins of right lower extremities with pain: Secondary | ICD-10-CM | POA: Insufficient documentation

## 2017-03-13 DIAGNOSIS — I872 Venous insufficiency (chronic) (peripheral): Secondary | ICD-10-CM | POA: Insufficient documentation

## 2017-04-21 DIAGNOSIS — I861 Scrotal varices: Secondary | ICD-10-CM | POA: Insufficient documentation

## 2017-09-27 HISTORY — PX: ENDOVENOUS ABLATION SAPHENOUS VEIN W/ LASER: SUR449

## 2017-12-15 ENCOUNTER — Ambulatory Visit (INDEPENDENT_AMBULATORY_CARE_PROVIDER_SITE_OTHER): Payer: Managed Care, Other (non HMO) | Admitting: Family Medicine

## 2017-12-15 ENCOUNTER — Encounter: Payer: Self-pay | Admitting: Family Medicine

## 2017-12-15 VITALS — BP 126/84 | HR 80 | Temp 97.9°F | Resp 18 | Ht 74.0 in | Wt 191.7 lb

## 2017-12-15 DIAGNOSIS — M546 Pain in thoracic spine: Secondary | ICD-10-CM

## 2017-12-15 DIAGNOSIS — M545 Low back pain: Secondary | ICD-10-CM

## 2017-12-15 DIAGNOSIS — B079 Viral wart, unspecified: Secondary | ICD-10-CM | POA: Diagnosis not present

## 2017-12-15 DIAGNOSIS — G8929 Other chronic pain: Secondary | ICD-10-CM

## 2017-12-15 DIAGNOSIS — Z23 Encounter for immunization: Secondary | ICD-10-CM

## 2017-12-15 DIAGNOSIS — F411 Generalized anxiety disorder: Secondary | ICD-10-CM

## 2017-12-15 DIAGNOSIS — F429 Obsessive-compulsive disorder, unspecified: Secondary | ICD-10-CM

## 2017-12-15 DIAGNOSIS — F325 Major depressive disorder, single episode, in full remission: Secondary | ICD-10-CM | POA: Diagnosis not present

## 2017-12-15 MED ORDER — ESCITALOPRAM OXALATE 5 MG PO TABS: 5.0000 mg | ORAL_TABLET | Freq: Every day | ORAL | 0 refills | Status: DC

## 2017-12-15 MED ORDER — BACLOFEN 20 MG PO TABS: 20.0000 mg | ORAL_TABLET | Freq: Every day | ORAL | 2 refills | Status: DC

## 2017-12-15 MED ORDER — BUSPIRONE HCL 5 MG PO TABS: 5.0000 mg | ORAL_TABLET | Freq: Every day | ORAL | 0 refills | Status: DC | PRN

## 2017-12-15 NOTE — Progress Notes (Signed)
Name: Ronald Hale   MRN: [id number]    DOB: 1980/11/12   Date:12/15/2017       Progress Note  Subjective  Chief Complaint  Chief Complaint  Patient presents with  . Medication Management  . Rash    right hand for about 8 to 9 months. no itching, no burning, no pain but has started to spread. no otc meds used  . Back Pain  . Immunizations    declines flu shot    HPI  Warts: he states started last year, about one year ago, but initially on right index finger but now it is spreading to his right hand and left, he states it bothers him a lot, self conscious, tried to scrub it in the past. He also tried to freeze and it caused pain but did not cure it.   Low back pain: he has been working at [employer] for the past 2 years and over the past 6 months he started to noticed low back pain, it is constant , usually 2/10 but at the end of the day, after work it goes 6-7/10, dull and aching, sometimes has a catch, very seldom goes down right leg. He is constantly lifting at least 30 lbs but also uses a pilot jacket that vibrates. No bowel or bladder incontinence.   Anxiety/Depression/OCD: he has gained weight ( good since he lost when very depressed), does not feel depressed, but still anxious and has OCD. He got a rx of Buspar 7.5 mg from Urgent Care and has been taking a forth prn, but gets nauseated and lightheaded so he has not been taking it very often. He stopped Zoloft because it was not working, discussed lexapro and he is willing to try it, discussed possible side effects including sexual side effects, yawning.    Patient Active Problem List   Diagnosis Date Noted  . Varicocele 04/21/2017  . Venous insufficiency 03/13/2017  . Varicose veins of leg with pain, right 03/13/2017  . Insomnia 06/12/2015  . GAD (generalized anxiety disorder) 05/15/2015  . Major depression in remission ([place]) 05/15/2015  . H. pylori infection 04/12/2015  . OCD (obsessive compulsive disorder)  03/23/2015  . Perennial allergic rhinitis with seasonal variation 08/23/2008    Past Surgical History:  Procedure Laterality Date  . ENDOVENOUS ABLATION SAPHENOUS VEIN W/ LASER Right 09/27/2017   Dr. Hurman Horn    History reviewed. No pertinent family history.  Social History   Socioeconomic History  . Marital status: Married    Spouse name: Araceli  . Number of children: 0  . Years of education: Not on file  . Highest education level: Associate degree: occupational, Hotel manager, or vocational program  Occupational History  . Occupation: wearhouse     Comment: Architect.   Social Needs  . Financial resource strain: Not hard at all  . Food insecurity:    Worry: Never true    Inability: Never true  . Transportation needs:    Medical: No    Non-medical: No  Tobacco Use  . Smoking status: Never Smoker  . Smokeless tobacco: Never Used  Substance and Sexual Activity  . Alcohol use: No    Alcohol/week: 0.0 standard drinks  . Drug use: No  . Sexual activity: Yes    Partners: Female    Birth control/protection: None  Lifestyle  . Physical activity:    Days per week: 4 days    Minutes per session: 150+ min  . Stress: Not at all  Relationships  . Social connections:    Talks on phone: Twice a week    Gets together: Once a week    Attends religious service: More than 4 times per year    Active member of club or organization: Yes    Attends meetings of clubs or organizations: More than 4 times per year    Relationship status: Married  . Intimate partner violence:    Fear of current or ex partner: No    Emotionally abused: No    Physically abused: No    Forced sexual activity: No  Other Topics Concern  . Not on file  Social History Narrative   Raised by grandparents since birth, no contact with parents, not much of family history available      Current Outpatient Medications:  .  busPIRone (BUSPAR) 5 MG tablet, Take 1 tablet (5 mg total) by mouth daily as  needed., Disp: 30 tablet, Rfl: 0 .  fluticasone (FLONASE) 50 MCG/ACT nasal spray, Place 2 sprays into both nostrils daily., Disp: 16 g, Rfl: 6 .  Methylcobalamin (B-12 FAST DISSOLVE SL), Place under the tongue., Disp: , Rfl:  .  escitalopram (LEXAPRO) 5 MG tablet, Take 1 tablet (5 mg total) by mouth daily., Disp: 30 tablet, Rfl: 0  No Known Allergies   ROS  Constitutional: Negative for fever, positive for  weight change.  Respiratory: Negative for cough and shortness of breath.   Cardiovascular: Negative for chest pain or palpitations.  Gastrointestinal: Negative for abdominal pain, no bowel changes.  Musculoskeletal: Negative for gait problem or joint swelling.  Skin: positive for rash.  Neurological: Negative for dizziness or headache.  No other specific complaints in a complete review of systems (except as listed in HPI above).  Objective  Vitals:   12/15/17 1332  BP: 126/84  Pulse: 80  Resp: 18  Temp: 97.9 F (36.6 C)  TempSrc: Oral  SpO2: 99%  Weight: 191 lb 11.2 oz (87 kg)  Height: 6\' 2"  (1.88 m)    Body mass index is 24.61 kg/m.  Physical Exam  Constitutional: Patient appears well-developed and well-nourished. Obese  No distress.  HEENT: head atraumatic, normocephalic, pupils equal and reactive to light,neck supple, throat within normal limits Cardiovascular: Normal rate, regular rhythm and normal heart sounds.  No murmur heard. No BLE edema. Pulmonary/Chest: Effort normal and breath sounds normal. No respiratory distress. Abdominal: Soft.  There is no tenderness. Psychiatric: Patient has a normal mood and affect. behavior is normal. Judgment and thought content normal. Muscular Skeletal: pain during palpation of lumbar spine normal , decrease rom secondary to stiffness, normal extension, no rashes, pain mostly on thoracic area   PHQ2/9: Depression screen [facility] 2/9 12/15/2017 01/08/2016 12/29/2015 05/15/2015 03/23/2015  Decreased Interest 0 0 0 0 0  Down, Depressed,  Hopeless 0 0 0 0 0  PHQ - 2 Score 0 0 0 0 0  Altered sleeping 0 - - - -  Tired, decreased energy 0 - - - -  Change in appetite 0 - - - -  Feeling bad or failure about yourself  0 - - - -  Trouble concentrating 0 - - - -  Moving slowly or fidgety/restless 0 - - - -  Suicidal thoughts 0 - - - -  PHQ-9 Score 0 - - - -  Difficult doing work/chores Not difficult at all - - - -      Fall Risk: Fall Risk  12/15/2017 01/08/2016 12/29/2015 05/15/2015 03/23/2015  Falls in the past  year? No No No No No   GAD 7 : Generalized Anxiety Score 12/15/2017 03/23/2015  Nervous, Anxious, on Edge 0 3  Control/stop worrying 0 3  Worry too much - different things 0 2  Trouble relaxing 0 3  Restless 1 3  Easily annoyed or irritable 0 1  Afraid - awful might happen 0 1  Total GAD 7 Score 1 16  Anxiety Difficulty Not difficult at all Extremely difficult      Functional Status Survey: Is the patient deaf or have difficulty hearing?: No Does the patient have difficulty seeing, even when wearing glasses/contacts?: No Does the patient have difficulty concentrating, remembering, or making decisions?: No Does the patient have difficulty walking or climbing stairs?: No Does the patient have difficulty dressing or bathing?: No Does the patient have difficulty doing errands alone such as visiting a doctor's office or shopping?: No    Assessment & Plan  1. Major depression in remission ([facility])  - busPIRone (BUSPAR) 5 MG tablet; Take 1 tablet (5 mg total) by mouth daily as needed.  Dispense: 30 tablet; Refill: 0 - escitalopram (LEXAPRO) 5 MG tablet; Take 1 tablet (5 mg total) by mouth daily.  Dispense: 30 tablet; Refill: 0  2. GAD (generalized anxiety disorder)  - busPIRone (BUSPAR) 5 MG tablet; Take 1 tablet (5 mg total) by mouth daily as needed.  Dispense: 30 tablet; Refill: 0 - escitalopram (LEXAPRO) 5 MG tablet; Take 1 tablet (5 mg total) by mouth daily.  Dispense: 30 tablet; Refill: 0  3.  Obsessive-compulsive disorder, unspecified type  - escitalopram (LEXAPRO) 5 MG tablet; Take 1 tablet (5 mg total) by mouth daily.  Dispense: 30 tablet; Refill: 0  4. Viral warts, unspecified type  - Ambulatory referral to Dermatology  5. Needs flu shot  Refused   6. Chronic bilateral low back pain without sciatica  Baclofen to take qhs prn # 30 with 2 refills.   7. Chronic bilateral thoracic back pain  See above

## 2017-12-15 NOTE — Patient Instructions (Addendum)
Back Pain, Adult Many adults have back pain from time to time. Common causes of back pain include:  A strained muscle or ligament.  Wear and tear (degeneration) of the spinal disks.  Arthritis.  A hit to the back.  Back pain can be short-lived (acute) or last a long time (chronic). A physical exam, lab tests, and imaging studies may be done to find the cause of your pain. Follow these instructions at home: Managing pain and stiffness Take over-the-counter and prescription medicines only as told by your health  Back Exercises The following exercises strengthen the muscles that help to support the back. They also help to keep the lower back flexible. Doing these exercises can help to prevent back pain or lessen existing pain. If you have back pain or discomfort, try doing these exercises 2-3 times each day or as told by your health care provider. When the pain goes away, do them once each day, but increase the number of times that you repeat the steps for each exercise (do more repetitions). If you do not have back pain or discomfort, do these exercises once each day or as told by your health care provider. Exercises Single Knee to Chest  Repeat these steps 3-5 times for each leg: 1. Lie on your back on a firm bed or the floor with your legs extended. 2. Bring one knee to your chest. Your other leg should stay extended and in contact with the floor. 3. Hold your knee in place by grabbing your knee or thigh. 4. Pull on your knee until you feel a gentle stretch in your lower back. 5. Hold the stretch for 10-30 seconds. 6. Slowly release and straighten your leg.  Pelvic Tilt  Repeat these steps 5-10 times: 1. Lie on your back on a firm bed or the floor with your legs extended. 2. Bend your knees so they are pointing toward the ceiling and your feet are flat on the floor. 3. Tighten your lower abdominal muscles to press your lower back against the floor. This motion will tilt your pelvis  so your tailbone points up toward the ceiling instead of pointing to your feet or the floor. 4. With gentle tension and even breathing, hold this position for 5-10 seconds.  Cat-Cow  Repeat these steps until your lower back becomes more flexible: 1. Get into a hands-and-knees position on a firm surface. Keep your hands under your shoulders, and keep your knees under your hips. You may place padding under your knees for comfort. 2. Let your head hang down, and point your tailbone toward the floor so your lower back becomes rounded like the back of a cat. 3. Hold this position for 5 seconds. 4. Slowly lift your head and point your tailbone up toward the ceiling so your back forms a sagging arch like the back of a cow. 5. Hold this position for 5 seconds.  Press-Ups  Repeat these steps 5-10 times: 1. Lie on your abdomen (face-down) on the floor. 2. Place your palms near your head, about shoulder-width apart. 3. While you keep your back as relaxed as possible and keep your hips on the floor, slowly straighten your arms to raise the top half of your body and lift your shoulders. Do not use your back muscles to raise your upper torso. You may adjust the placement of your hands to make yourself more comfortable. 4. Hold this position for 5 seconds while you keep your back relaxed. 5. Slowly return to lying flat  on the floor.  Bridges  Repeat these steps 10 times: 1. Lie on your back on a firm surface. 2. Bend your knees so they are pointing toward the ceiling and your feet are flat on the floor. 3. Tighten your buttocks muscles and lift your buttocks off of the floor until your waist is at almost the same height as your knees. You should feel the muscles working in your buttocks and the back of your thighs. If you do not feel these muscles, slide your feet 1-2 inches farther away from your buttocks. 4. Hold this position for 3-5 seconds. 5. Slowly lower your hips to the starting position, and  allow your buttocks muscles to relax completely.  If this exercise is too easy, try doing it with your arms crossed over your chest. Abdominal Crunches  Repeat these steps 5-10 times: 1. Lie on your back on a firm bed or the floor with your legs extended. 2. Bend your knees so they are pointing toward the ceiling and your feet are flat on the floor. 3. Cross your arms over your chest. 4. Tip your chin slightly toward your chest without bending your neck. 5. Tighten your abdominal muscles and slowly raise your trunk (torso) high enough to lift your shoulder blades a tiny bit off of the floor. Avoid raising your torso higher than that, because it can put too much stress on your low back and it does not help to strengthen your abdominal muscles. 6. Slowly return to your starting position.  Back Lifts Repeat these steps 5-10 times: 1. Lie on your abdomen (face-down) with your arms at your sides, and rest your forehead on the floor. 2. Tighten the muscles in your legs and your buttocks. 3. Slowly lift your chest off of the floor while you keep your hips pressed to the floor. Keep the back of your head in line with the curve in your back. Your eyes should be looking at the floor. 4. Hold this position for 3-5 seconds. 5. Slowly return to your starting position.  Contact a health care provider if:  Your back pain or discomfort gets much worse when you do an exercise.  Your back pain or discomfort does not lessen within 2 hours after you exercise. If you have any of these problems, stop doing these exercises right away. Do not do them again unless your health care provider says that you can. Get help right away if:  You develop sudden, severe back pain. If this happens, stop doing the exercises right away. Do not do them again unless your health care provider says that you can. This information is not intended to replace advice given to you by your health care provider. Make sure you discuss  any questions you have with your health care provider. Document Released: 05/02/2004 Document Revised: 08/02/2015 Document Reviewed: 05/19/2014 Elsevier Interactive Patient Education  2017 [employer].  care provider.  If directed, apply heat to the affected area as often as told by your health care provider. Use the heat source that your health care provider recommends, such as a moist heat pack or a heating pad. ? Place a towel between your skin and the heat source. ? Leave the heat on for 20-30 minutes. ? Remove the heat if your skin turns bright red. This is especially important if you are unable to feel pain, heat, or cold. You have a greater risk of getting burned.  If directed, apply ice to the injured area: ? Put ice  in a plastic bag. ? Place a towel between your skin and the bag. ? Leave the ice on for 20 minutes, 2-3 times a day for the first 2-3 days. Activity  Do not stay in bed. Resting more than 1-2 days can delay your recovery.  Take short walks on even surfaces as soon as you are able. Try to increase the length of time you walk each day.  Do not sit, drive, or stand in one place for more than 30 minutes at a time. Sitting or standing for long periods of time can put stress on your back.  Use proper lifting techniques. When you bend and lift, use positions that put less stress on your back: ? [place] your knees. ? Keep the load close to your body. ? Avoid twisting.  Exercise regularly as told by your health care provider. Exercising will help your back heal faster. This also helps prevent back injuries by keeping muscles strong and flexible.  Your health care provider may recommend that you see a physical therapist. This person can help you come up with a safe exercise program. Do any exercises as told by your physical therapist. Lifestyle  Maintain a healthy weight. Extra weight puts stress on your back and makes it difficult to have good posture.  Avoid activities  or situations that make you feel anxious or stressed. Learn ways to manage anxiety and stress. One way to manage stress is through exercise. Stress and anxiety increase muscle tension and can make back pain worse. General instructions  Sleep on a firm mattress in a comfortable position. Try lying on your side with your knees slightly bent. If you lie on your back, put a pillow under your knees.  Follow your treatment plan as told by your health care provider. This may include: ? Cognitive or behavioral therapy. ? Acupuncture or massage therapy. ? Meditation or yoga. Contact a health care provider if:  You have pain that is not relieved with rest or medicine.  You have increasing pain going down into your legs or buttocks.  Your pain does not improve in 2 weeks.  You have pain at night.  You lose weight.  You have a fever or chills. Get help right away if:  You develop new bowel or bladder control problems.  You have unusual weakness or numbness in your arms or legs.  You develop nausea or vomiting.  You develop abdominal pain.  You feel faint. Summary  Many adults have back pain from time to time. A physical exam, lab tests, and imaging studies may be done to find the cause of your pain.  Use proper lifting techniques. When you bend and lift, use positions that put less stress on your back.  Take over-the-counter and prescription medicines and apply heat or ice as directed by your health care provider. This information is not intended to replace advice given to you by your health care provider. Make sure you discuss any questions you have with your health care provider. Document Released: 03/25/2005 Document Revised: 04/29/2016 Document Reviewed: 04/29/2016 Elsevier Interactive Patient Education  [employer].

## 2018-01-07 ENCOUNTER — Other Ambulatory Visit: Payer: Self-pay | Admitting: Family Medicine

## 2018-01-07 DIAGNOSIS — F429 Obsessive-compulsive disorder, unspecified: Secondary | ICD-10-CM

## 2018-01-07 DIAGNOSIS — F325 Major depressive disorder, single episode, in full remission: Secondary | ICD-10-CM

## 2018-01-07 DIAGNOSIS — F411 Generalized anxiety disorder: Secondary | ICD-10-CM

## 2018-01-22 ENCOUNTER — Encounter: Payer: Self-pay | Admitting: Family Medicine

## 2018-01-22 ENCOUNTER — Ambulatory Visit (INDEPENDENT_AMBULATORY_CARE_PROVIDER_SITE_OTHER): Payer: Managed Care, Other (non HMO) | Admitting: Family Medicine

## 2018-01-22 VITALS — BP 118/62 | HR 74 | Temp 98.0°F | Resp 16 | Ht 73.0 in | Wt 187.5 lb

## 2018-01-22 DIAGNOSIS — B078 Other viral warts: Secondary | ICD-10-CM

## 2018-01-22 DIAGNOSIS — E538 Deficiency of other specified B group vitamins: Secondary | ICD-10-CM

## 2018-01-22 DIAGNOSIS — F325 Major depressive disorder, single episode, in full remission: Secondary | ICD-10-CM

## 2018-01-22 DIAGNOSIS — Z1159 Encounter for screening for other viral diseases: Secondary | ICD-10-CM | POA: Diagnosis not present

## 2018-01-22 DIAGNOSIS — F411 Generalized anxiety disorder: Secondary | ICD-10-CM | POA: Diagnosis not present

## 2018-01-22 DIAGNOSIS — Z0000 Encounter for general adult medical examination without abnormal findings: Secondary | ICD-10-CM | POA: Diagnosis not present

## 2018-01-22 DIAGNOSIS — Z13220 Encounter for screening for lipoid disorders: Secondary | ICD-10-CM

## 2018-01-22 DIAGNOSIS — Z131 Encounter for screening for diabetes mellitus: Secondary | ICD-10-CM | POA: Diagnosis not present

## 2018-01-22 DIAGNOSIS — Z79899 Other long term (current) drug therapy: Secondary | ICD-10-CM

## 2018-01-22 DIAGNOSIS — F429 Obsessive-compulsive disorder, unspecified: Secondary | ICD-10-CM

## 2018-01-22 MED ORDER — ESCITALOPRAM OXALATE 5 MG PO TABS: 5.0000 mg | ORAL_TABLET | Freq: Every day | ORAL | 1 refills | Status: DC

## 2018-01-22 MED ORDER — BUSPIRONE HCL 5 MG PO TABS: 5.0000 mg | ORAL_TABLET | Freq: Every day | ORAL | 1 refills | Status: DC | PRN

## 2018-01-22 NOTE — Patient Instructions (Signed)
Preventive Care 18-39 Years, Male Preventive care refers to lifestyle choices and visits with your health care provider that can promote health and wellness. What does preventive care include?  A yearly physical exam. This is also called an annual well check.  Dental exams once or twice a year.  Routine eye exams. Ask your health care provider how often you should have your eyes checked.  Personal lifestyle choices, including: ? Daily care of your teeth and gums. ? Regular physical activity. ? Eating a healthy diet. ? Avoiding tobacco and drug use. ? Limiting alcohol use. ? Practicing safe sex. What happens during an annual well check? The services and screenings done by your health care provider during your annual well check will depend on your age, overall health, lifestyle risk factors, and family history of disease. Counseling Your health care provider may ask you questions about your:  Alcohol use.  Tobacco use.  Drug use.  Emotional well-being.  Home and relationship well-being.  Sexual activity.  Eating habits.  Work and work Statistician.  Screening You may have the following tests or measurements:  Height, weight, and BMI.  Blood pressure.  Lipid and cholesterol levels. These may be checked every 5 years starting at age 34.  Diabetes screening. This is done by checking your blood sugar (glucose) after you have not eaten for a while (fasting).  Skin check.  Hepatitis C blood test.  Hepatitis B blood test.  Sexually transmitted disease (STD) testing.  Discuss your test results, treatment options, and if necessary, the need for more tests with your health care provider. Vaccines Your health care provider may recommend certain vaccines, such as:  Influenza vaccine. This is recommended every year.  Tetanus, diphtheria, and acellular pertussis (Tdap, Td) vaccine. You may need a Td booster every 10 years.  Varicella vaccine. You may need this if you  have not been vaccinated.  HPV vaccine. If you are 23 or younger, you may need three doses over 6 months.  Measles, mumps, and rubella (MMR) vaccine. You may need at least one dose of MMR.You may also need a second dose.  Pneumococcal 13-valent conjugate (PCV13) vaccine. You may need this if you have certain conditions and have not been vaccinated.  Pneumococcal polysaccharide (PPSV23) vaccine. You may need one or two doses if you smoke cigarettes or if you have certain conditions.  Meningococcal vaccine. One dose is recommended if you are age 65-21 years and a first-year college student living in a residence hall, or if you have one of several medical conditions. You may also need additional booster doses.  Hepatitis A vaccine. You may need this if you have certain conditions or if you travel or work in places where you may be exposed to hepatitis A.  Hepatitis B vaccine. You may need this if you have certain conditions or if you travel or work in places where you may be exposed to hepatitis B.  Haemophilus influenzae type b (Hib) vaccine. You may need this if you have certain risk factors.  Talk to your health care provider about which screenings and vaccines you need and how often you need them. This information is not intended to replace advice given to you by your health care provider. Make sure you discuss any questions you have with your health care provider. Document Released: 05/21/2001 Document Revised: 12/13/2015 Document Reviewed: 01/24/2015 Elsevier Interactive Patient Education  [employer].

## 2018-01-22 NOTE — Progress Notes (Signed)
Name: Ronald Hale   MRN: [id number]    DOB: [birth year]   Date:01/22/2018       Progress Note  Subjective  Chief Complaint  Chief Complaint  Patient presents with  . Annual Exam  . Immunizations    declines flu shot  . Labs Only    fasting    HPI  Patient presents for annual CPE and follow up  Warts: he states started last year, about one year ago, but initially on right index finger but now it is spreading to his right hand and left, he states it bothers him a lot, self conscious, tried to scrub it in the past. He also tried to freeze and it caused pain but did not cure it.  We placed referral to Dr. Nehemiah Massed back in Sept but he did not get an appointment we will contact his office for him today   Low back pain: he has been working at [employer] for the past 2 years and over the past 7 months he started to noticed low back pain, it is constant , usually 2/10 but at the end of the day, after work it goes 7/10, dull and aching, sometimes has a catch, very seldom goes down right leg. He is constantly lifting at least 30 lbs but also uses a pilot jacket that vibrates. No bowel or bladder incontinence. We gave him baclofen and he takes prn, it seems to help with symptoms.   Anxiety/Depression/OCD: he lost to follow up , but came back 12/2017, weight is stable now. Does  not feel depressed, but still anxious and has OCD, phq 9 was normal today, GAD improved.. Buspar 5 mg is better than 7.5 ( it was making him feel dizzy on higher dose)  He stopped Zoloft because it was not working, since 12/2017 he is on lexapro 5 mg daily and denies side effects. He does not want to go up on dose.    Diet: drinks sodas at work, avoids desserts and other sweets, advised to cut down on sodas.  Exercise: physical job  Depression:  Depression screen [facility] 2/9 01/22/2018 12/15/2017 01/08/2016 12/29/2015 05/15/2015  Decreased Interest 0 0 0 0 0  Down, Depressed, Hopeless 0 0 0 0 0  PHQ - 2 Score 0  0 0 0 0  Altered sleeping 0 0 - - -  Tired, decreased energy 1 0 - - -  Change in appetite 0 0 - - -  Feeling bad or failure about yourself  0 0 - - -  Trouble concentrating 0 0 - - -  Moving slowly or fidgety/restless 0 0 - - -  Suicidal thoughts 0 0 - - -  PHQ-9 Score 1 0 - - -  Difficult doing work/chores Not difficult at all Not difficult at all - - -    Hypertension:  BP Readings from Last 3 Encounters:  01/22/18 118/62  12/15/17 126/84  01/08/16 114/68    Obesity: Wt Readings from Last 3 Encounters:  01/22/18 187 lb 8 oz (85 kg)  12/15/17 191 lb 11.2 oz (87 kg)  01/08/16 154 lb 9 oz (70.1 kg)   BMI Readings from Last 3 Encounters:  01/22/18 24.74 kg/m  12/15/17 24.61 kg/m  01/08/16 19.84 kg/m     Lipids:  No results found for: CHOL No results found for: HDL No results found for: LDLCALC No results found for: TRIG No results found for: CHOLHDL No results found for: LDLDIRECT Glucose:  Glucose  Date Value  Ref Range Status  07/03/2015 81 65 - 99 mg/dL Final   Glucose, Bld  Date Value Ref Range Status  12/29/2015 87 65 - 99 mg/dL Final      Office Visit from 01/22/2018 in [facility]  AUDIT-C Score  0      Married STD testing and prevention (HIV/chl/gon/syphilis): N/A Hep C: today   Skin cancer: warts , discussed atypical lesions   IPSS Questionnaire (AUA-7): Over the past month.   1)  How often have you had a sensation of not emptying your bladder completely after you finish urinating?  0 - Not at all  2)  How often have you had to urinate again less than two hours after you finished urinating? 0 - Not at all  3)  How often have you found you stopped and started again several times when you urinated?  1 - Less than 1 time in 5  4) How difficult have you found it to postpone urination?  0 - Not at all  5) How often have you had a weak urinary stream?  0 - Not at all  6) How often have you had to push or strain to begin  urination?  0 - Not at all  7) How many times did you most typically get up to urinate from the time you went to bed until the time you got up in the morning?  0 - None  Total score:  0-7 mildly symptomatic   8-19 moderately symptomatic   20-35 severely symptomatic     Advanced Care Planning: A voluntary discussion about advance care planning including the explanation and discussion of advance directives.  Discussed health care proxy and Living will, and the patient was able to identify a health care proxy as wife  Patient does not have a living will at present time.  Patient Active Problem List   Diagnosis Date Noted  . Varicocele 04/21/2017  . Venous insufficiency 03/13/2017  . Varicose veins of leg with pain, right 03/13/2017  . Insomnia 06/12/2015  . GAD (generalized anxiety disorder) 05/15/2015  . Major depression in remission ([place]) 05/15/2015  . H. pylori infection 04/12/2015  . OCD (obsessive compulsive disorder) 03/23/2015  . Perennial allergic rhinitis with seasonal variation 08/23/2008    Past Surgical History:  Procedure Laterality Date  . ENDOVENOUS ABLATION SAPHENOUS VEIN W/ LASER Right 09/27/2017   Dr. Hurman Horn    History reviewed. No pertinent family history.  Social History   Socioeconomic History  . Marital status: Married    Spouse name: Araceli  . Number of children: 0  . Years of education: Not on file  . Highest education level: Associate degree: occupational, Hotel manager, or vocational program  Occupational History  . Occupation: wearhouse     Comment: Architect.   Social Needs  . Financial resource strain: Not hard at all  . Food insecurity:    Worry: Never true    Inability: Never true  . Transportation needs:    Medical: No    Non-medical: No  Tobacco Use  . Smoking status: Never Smoker  . Smokeless tobacco: Never Used  Substance and Sexual Activity  . Alcohol use: No    Alcohol/week: 0.0 standard drinks  . Drug use: No  .  Sexual activity: Yes    Partners: Female    Birth control/protection: None  Lifestyle  . Physical activity:    Days per week: 4 days    Minutes per session: 150+ min  .  Stress: Not at all  Relationships  . Social connections:    Talks on phone: Twice a week    Gets together: Once a week    Attends religious service: More than 4 times per year    Active member of club or organization: Yes    Attends meetings of clubs or organizations: More than 4 times per year    Relationship status: Married  . Intimate partner violence:    Fear of current or ex partner: No    Emotionally abused: No    Physically abused: No    Forced sexual activity: No  Other Topics Concern  . Not on file  Social History Narrative   Raised by grandparents since birth, no contact with parents, not much of family history available      Current Outpatient Medications:  .  baclofen (LIORESAL) 20 MG tablet, Take 1 tablet (20 mg total) by mouth at bedtime. For back pain, Disp: 30 each, Rfl: 2 .  busPIRone (BUSPAR) 5 MG tablet, TAKE 1 TABLET (5 MG TOTAL) BY MOUTH DAILY AS NEEDED., Disp: 30 tablet, Rfl: 0 .  escitalopram (LEXAPRO) 5 MG tablet, TAKE 1 TABLET BY MOUTH EVERY DAY, Disp: 30 tablet, Rfl: 0 .  fluticasone (FLONASE) 50 MCG/ACT nasal spray, Place 2 sprays into both nostrils daily., Disp: 16 g, Rfl: 6 .  Methylcobalamin (B-12 FAST DISSOLVE SL), Place under the tongue., Disp: , Rfl:   No Known Allergies   ROS  Constitutional: Negative for fever or weight change.  Respiratory: Negative for cough and shortness of breath.   Cardiovascular: Negative for chest pain or palpitations.  Gastrointestinal: Negative for abdominal pain, no bowel changes.  Musculoskeletal: Negative for gait problem or joint swelling.  Skin: Negative for rash.  Neurological: Negative for dizziness or headache.  No other specific complaints in a complete review of systems (except as listed in HPI above).   Objective  Vitals:    01/22/18 0834  BP: 118/62  Pulse: 74  Resp: 16  Temp: 98 F (36.7 C)  TempSrc: Oral  SpO2: 98%  Weight: 187 lb 8 oz (85 kg)  Height: 6\' 1"  (1.854 m)    Body mass index is 24.74 kg/m.  Physical Exam   Constitutional: Patient appears well-developed and well-nourished. No distress.  HENT: Head: Normocephalic and atraumatic. Ears: B TMs ok, no erythema or effusion; Nose: Nose normal. Mouth/Throat: Oropharynx is clear and moist. No oropharyngeal exudate.  Eyes: Conjunctivae and EOM are normal. Pupils are equal, round, and reactive to light. No scleral icterus.  Neck: Normal range of motion. Neck supple. No JVD present. No thyromegaly present.  Cardiovascular: Normal rate, regular rhythm and normal heart sounds.  No murmur heard. No BLE edema. Pulmonary/Chest: Effort normal and breath sounds normal. No respiratory distress. Abdominal: Soft. Bowel sounds are normal, no distension. There is no tenderness. no masses MALE GENITALIA: Normal descended testes bilaterally, no masses palpated, no hernias, no lesions, no discharge RECTAL: not done Musculoskeletal: Normal range of motion, no joint effusions. No gross deformities Neurological: he is alert and oriented to person, place, and time. No cranial nerve deficit. Coordination, balance, strength, speech and gait are normal.  Skin: Skin is warm and dry. No rash noted. No erythema.  Psychiatric: Patient has a normal mood and affect. behavior is normal. Judgment and thought content normal.   PHQ2/9: Depression screen [facility] 2/9 01/22/2018 12/15/2017 01/08/2016 12/29/2015 05/15/2015  Decreased Interest 0 0 0 0 0  Down, Depressed, Hopeless 0 0 0 0  0  PHQ - 2 Score 0 0 0 0 0  Altered sleeping 0 0 - - -  Tired, decreased energy 1 0 - - -  Change in appetite 0 0 - - -  Feeling bad or failure about yourself  0 0 - - -  Trouble concentrating 0 0 - - -  Moving slowly or fidgety/restless 0 0 - - -  Suicidal thoughts 0 0 - - -  PHQ-9 Score 1 0 - - -   Difficult doing work/chores Not difficult at all Not difficult at all - - -   GAD 7 : Generalized Anxiety Score 01/22/2018 12/15/2017 03/23/2015  Nervous, Anxious, on Edge 1 0 3  Control/stop worrying 0 0 3  Worry too much - different things 0 0 2  Trouble relaxing 0 0 3  Restless 2 1 3   Easily annoyed or irritable 0 0 1  Afraid - awful might happen 0 0 1  Total GAD 7 Score 3 1 16   Anxiety Difficulty Not difficult at all Not difficult at all Extremely difficult     Fall Risk: Fall Risk  01/22/2018 12/15/2017 01/08/2016 12/29/2015 05/15/2015  Falls in the past year? No No No No No     Functional Status Survey: Is the patient deaf or have difficulty hearing?: No Does the patient have difficulty seeing, even when wearing glasses/contacts?: No(patient uses reading glasses at times) Does the patient have difficulty concentrating, remembering, or making decisions?: No Does the patient have difficulty walking or climbing stairs?: No Does the patient have difficulty dressing or bathing?: No Does the patient have difficulty doing errands alone such as visiting a doctor's office or shopping?: No   Assessment & Plan   1. Encounter for routine history and physical exam for male   2. Major depression in remission ([place])  - escitalopram (LEXAPRO) 5 MG tablet; Take 1 tablet (5 mg total) by mouth daily.  Dispense: 90 tablet; Refill: 1 - busPIRone (BUSPAR) 5 MG tablet; Take 1 tablet (5 mg total) by mouth daily as needed.  Dispense: 90 tablet; Refill: 1  3. GAD (generalized anxiety disorder)  - escitalopram (LEXAPRO) 5 MG tablet; Take 1 tablet (5 mg total) by mouth daily.  Dispense: 90 tablet; Refill: 1 - busPIRone (BUSPAR) 5 MG tablet; Take 1 tablet (5 mg total) by mouth daily as needed.  Dispense: 90 tablet; Refill: 1  4. Obsessive-compulsive disorder, unspecified type  - escitalopram (LEXAPRO) 5 MG tablet; Take 1 tablet (5 mg total) by mouth daily.  Dispense: 90 tablet; Refill: 1  5.  Diabetes mellitus screening  - Hemoglobin A1c  6. Lipid screening  - Lipid panel  7. Low serum vitamin B12  - Vitamin B12 - CBC with Differential/Platelet  8. Other viral warts  - Ambulatory referral to Dermatology  9. Long-term use of high-risk medication  - COMPLETE METABOLIC PANEL WITH GFR   -Prostate cancer screening and PSA options (with potential risks and benefits of testing vs not testing) were discussed along with recent recs/guidelines. -USPSTF grade A and B recommendations reviewed with patient; age-appropriate recommendations, preventive care, screening tests, etc discussed and encouraged; healthy living encouraged; see AVS for patient education given to patient -Discussed importance of 150 minutes of physical activity weekly, eat two servings of fish weekly, eat one serving of tree nuts ( cashews, pistachios, pecans, almonds.Marland Kitchen) every other day, eat 6 servings of fruit/vegetables daily and drink plenty of water and avoid sweet beverages.

## 2018-01-23 LAB — LIPID PANEL
CHOLESTEROL: 231 mg/dL — AB (ref <200)
HDL: 40 mg/dL — ABNORMAL LOW (ref >40)
LDL Cholesterol (Calc): 164 mg/dL (calc) — ABNORMAL HIGH
Non-HDL Cholesterol (Calc): 191 mg/dL (calc) — ABNORMAL HIGH (ref <130)
TRIGLYCERIDES: 135 mg/dL (ref <150)
Total CHOL/HDL Ratio: 5.8 (calc) — ABNORMAL HIGH (ref <5.0)

## 2018-01-23 LAB — CBC WITH DIFFERENTIAL/PLATELET
Basophils Absolute: 52 cells/uL (ref 0–200)
Basophils Relative: 1 %
EOS PCT: 1.3 %
Eosinophils Absolute: 68 cells/uL (ref 15–500)
HCT: 45 % (ref 38.5–50.0)
Hemoglobin: 15.8 g/dL (ref 13.2–17.1)
Lymphs Abs: 1908 cells/uL (ref 850–3900)
MCH: 31 pg (ref 27.0–33.0)
MCHC: 35.1 g/dL (ref 32.0–36.0)
MCV: 88.4 fL (ref 80.0–100.0)
MONOS PCT: 10.2 %
MPV: 10.1 fL (ref 7.5–12.5)
NEUTROS PCT: 50.8 %
Neutro Abs: 2642 cells/uL (ref 1500–7800)
PLATELETS: 209 10*3/uL (ref 140–400)
RBC: 5.09 10*6/uL (ref 4.20–5.80)
RDW: 12 % (ref 11.0–15.0)
TOTAL LYMPHOCYTE: 36.7 %
WBC: 5.2 10*3/uL (ref 3.8–10.8)
WBCMIX: 530 {cells}/uL (ref 200–950)

## 2018-01-23 LAB — COMPLETE METABOLIC PANEL WITH GFR
AG RATIO: 1.8 (calc) (ref 1.0–2.5)
ALBUMIN MSPROF: 4.6 g/dL (ref 3.6–5.1)
ALKALINE PHOSPHATASE (APISO): 70 U/L (ref 40–115)
ALT: 11 U/L (ref 9–46)
AST: 16 U/L (ref 10–40)
BILIRUBIN TOTAL: 0.9 mg/dL (ref 0.2–1.2)
BUN: 13 mg/dL (ref 7–25)
CHLORIDE: 102 mmol/L (ref 98–110)
CO2: 32 mmol/L (ref 20–32)
Calcium: 9.1 mg/dL (ref 8.6–10.3)
Creat: 0.86 mg/dL (ref 0.60–1.35)
GFR, Est African American: 129 mL/min/{1.73_m2} (ref >=60)
GFR, Est Non African American: 112 mL/min/{1.73_m2} (ref >=60)
GLOBULIN: 2.6 g/dL (ref 1.9–3.7)
Glucose, Bld: 76 mg/dL (ref 65–99)
POTASSIUM: 3.8 mmol/L (ref 3.5–5.3)
SODIUM: 141 mmol/L (ref 135–146)
Total Protein: 7.2 g/dL (ref 6.1–8.1)

## 2018-01-23 LAB — HEMOGLOBIN A1C
HEMOGLOBIN A1C: 4.8 %{Hb} (ref <5.7)
Mean Plasma Glucose: 91 (calc)
eAG (mmol/L): 5 (calc)

## 2018-01-23 LAB — HEPATITIS C ANTIBODY
HEP C AB: NONREACTIVE
SIGNAL TO CUT-OFF: 0.02 (ref <1.00)

## 2018-01-23 LAB — VITAMIN B12: Vitamin B-12: 301 pg/mL (ref 200–1100)

## 2018-07-27 ENCOUNTER — Ambulatory Visit: Payer: Self-pay | Admitting: Family Medicine

## 2018-10-28 ENCOUNTER — Emergency Department (HOSPITAL_BASED_OUTPATIENT_CLINIC_OR_DEPARTMENT_OTHER)
Admission: EM | Admit: 2018-10-28 | Discharge: 2018-10-28 | Disposition: A | Discharge status: Home / Self Care | Payer: No Typology Code available for payment source | Attending: Emergency Medicine | Admitting: Emergency Medicine

## 2018-10-28 ENCOUNTER — Other Ambulatory Visit: Payer: Self-pay

## 2018-10-28 ENCOUNTER — Emergency Department (HOSPITAL_BASED_OUTPATIENT_CLINIC_OR_DEPARTMENT_OTHER): Payer: No Typology Code available for payment source

## 2018-10-28 ENCOUNTER — Encounter (HOSPITAL_BASED_OUTPATIENT_CLINIC_OR_DEPARTMENT_OTHER): Payer: Self-pay | Admitting: Emergency Medicine

## 2018-10-28 DIAGNOSIS — X500XXA Overexertion from strenuous movement or load, initial encounter: Secondary | ICD-10-CM | POA: Diagnosis not present

## 2018-10-28 DIAGNOSIS — S39012A Strain of muscle, fascia and tendon of lower back, initial encounter: Secondary | ICD-10-CM | POA: Insufficient documentation

## 2018-10-28 DIAGNOSIS — Y990 Civilian activity done for income or pay: Secondary | ICD-10-CM | POA: Insufficient documentation

## 2018-10-28 DIAGNOSIS — Y9259 Other trade areas as the place of occurrence of the external cause: Secondary | ICD-10-CM | POA: Insufficient documentation

## 2018-10-28 DIAGNOSIS — Y9389 Activity, other specified: Secondary | ICD-10-CM | POA: Insufficient documentation

## 2018-10-28 DIAGNOSIS — M5489 Other dorsalgia: Secondary | ICD-10-CM | POA: Diagnosis present

## 2018-10-28 MED ORDER — NAPROXEN 250 MG PO TABS
500.0000 mg | ORAL_TABLET | Freq: Once | ORAL | Status: AC
  Administered 2018-10-28: 03:00:00 500 mg via ORAL
  Filled 2018-10-28: qty 2

## 2018-10-28 MED ORDER — NAPROXEN 500 MG PO TABS: 500.0000 mg | ORAL_TABLET | Freq: Two times a day (BID) | ORAL | 0 refills | Status: DC

## 2018-10-28 MED ORDER — LIDOCAINE 5 % EX PTCH: 1.0000 | MEDICATED_PATCH | CUTANEOUS | 0 refills | Status: DC

## 2018-10-28 MED ORDER — ACETAMINOPHEN 500 MG PO TABS
1000.0000 mg | ORAL_TABLET | Freq: Once | ORAL | Status: AC
  Administered 2018-10-28: 1000 mg via ORAL
  Filled 2018-10-28: qty 2

## 2018-10-28 NOTE — ED Triage Notes (Signed)
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

## 2018-10-28 NOTE — ED Provider Notes (Signed)
[place] EMERGENCY DEPARTMENT Provider Note   CSN: [id number] Arrival date & time: 10/28/18  0208     History   Chief Complaint Chief Complaint  Patient presents with  . Back Pain    HPI Ronald Hale is a 38 y.o. male.      Back Pain Location:  Gluteal region Quality:  Cramping Radiates to:  L posterior upper leg Pain severity:  Severe Pain is:  Same all the time Onset quality:  Sudden Duration:  2 hours Timing:  Constant Progression:  Unchanged Chronicity:  New Context: lifting heavy objects   Context comment:  Lifted a box at work and felt it Relieved by:  Nothing Worsened by:  Nothing Ineffective treatments:  None tried Associated symptoms: no abdominal pain, no abdominal swelling, no bladder incontinence, no bowel incontinence, no chest pain, no dysuria, no fever, no headaches, no leg pain, no numbness, no paresthesias, no pelvic pain, no perianal numbness, no tingling, no weakness and no weight loss   Risk factors: no hx of cancer     Past Medical History:  Diagnosis Date  . Allergy   . GERD (gastroesophageal reflux disease)   . Left arm pain   . Wheezing     Patient Active Problem List   Diagnosis Date Noted  . Varicocele 04/21/2017  . Venous insufficiency 03/13/2017  . Varicose veins of leg with pain, right 03/13/2017  . Insomnia 06/12/2015  . GAD (generalized anxiety disorder) 05/15/2015  . Major depression in remission ([place]) 05/15/2015  . H. pylori infection 04/12/2015  . OCD (obsessive compulsive disorder) 03/23/2015  . Perennial allergic rhinitis with seasonal variation 08/23/2008    Past Surgical History:  Procedure Laterality Date  . ENDOVENOUS ABLATION SAPHENOUS VEIN W/ LASER Right 09/27/2017   Dr. Hurman Horn        Home Medications    Prior to Admission medications   Not on File    Family History History reviewed. No pertinent family history.  Social History Social History   Tobacco Use  .  Smoking status: Never Smoker  . Smokeless tobacco: Never Used  Substance Use Topics  . Alcohol use: No    Alcohol/week: 0.0 standard drinks  . Drug use: No     Allergies   Patient has no known allergies.   Review of Systems Review of Systems  Constitutional: Negative for fever and weight loss.  Respiratory: Negative for cough and shortness of breath.   Cardiovascular: Negative for chest pain.  Gastrointestinal: Negative for abdominal pain and bowel incontinence.  Genitourinary: Negative for bladder incontinence, difficulty urinating, dysuria and pelvic pain.  Musculoskeletal: Positive for back pain. Negative for gait problem.  Neurological: Negative for tingling, weakness, numbness, headaches and paresthesias.  All other systems reviewed and are negative.    Physical Exam Updated Vital Signs BP (!) 131/93 (BP Location: Right Arm)   Pulse 61   Temp 97.8 F (36.6 C) (Oral)   Resp 16   Ht 6\' 2"  (1.88 m)   Wt 84.4 kg   SpO2 100%   BMI 23.88 kg/m   Physical Exam Vitals signs and nursing note reviewed.  Constitutional:      General: He is not in acute distress.    Appearance: He is normal weight.  HENT:     Head: Normocephalic and atraumatic.     Nose: Nose normal.  Eyes:     Extraocular Movements: Extraocular movements intact.     Conjunctiva/sclera: Conjunctivae normal.  Neck:  Musculoskeletal: Normal range of motion and neck supple.  Cardiovascular:     Rate and Rhythm: Normal rate and regular rhythm.     Pulses: Normal pulses.     Heart sounds: Normal heart sounds.  Pulmonary:     Effort: Pulmonary effort is normal.     Breath sounds: Normal breath sounds.  Abdominal:     General: Abdomen is flat. Bowel sounds are normal.     Tenderness: There is no abdominal tenderness. There is no guarding.  Musculoskeletal: Normal range of motion.  Skin:    General: Skin is warm and dry.     Capillary Refill: Capillary refill takes less than 2 seconds.   Neurological:     General: No focal deficit present.     Mental Status: He is alert. Mental status is at baseline.     Gait: Gait normal.     Deep Tendon Reflexes: Reflexes normal.     Comments: Intact L5/s1  Psychiatric:        Mood and Affect: Mood normal.        Behavior: Behavior normal.      ED Treatments / Results  Labs (all labs ordered are listed, but only abnormal results are displayed) Labs Reviewed - No data to display  EKG None  Radiology Results for orders placed or performed in visit on 01/22/18  Hemoglobin A1c  Result Value Ref Range   Hgb A1c MFr Bld 4.8 <5.7 % of total Hgb   Mean Plasma Glucose 91 (calc)   eAG (mmol/L) 5.0 (calc)  Vitamin B12  Result Value Ref Range   Vitamin B-12 301 200 - 1,100 pg/mL  COMPLETE METABOLIC PANEL WITH GFR  Result Value Ref Range   Glucose, Bld 76 65 - 99 mg/dL   BUN 13 7 - 25 mg/dL   Creat 0.86 0.60 - 1.35 mg/dL   GFR, Est Non African American 112 > OR = 60 mL/min/1.21m2   GFR, Est African American 129 > OR = 60 mL/min/1.71m2   BUN/Creatinine Ratio NOT APPLICABLE 6 - 22 (calc)   Sodium 141 135 - 146 mmol/L   Potassium 3.8 3.5 - 5.3 mmol/L   Chloride 102 98 - 110 mmol/L   CO2 32 20 - 32 mmol/L   Calcium 9.1 8.6 - 10.3 mg/dL   Total Protein 7.2 6.1 - 8.1 g/dL   Albumin 4.6 3.6 - 5.1 g/dL   Globulin 2.6 1.9 - 3.7 g/dL (calc)   AG Ratio 1.8 1.0 - 2.5 (calc)   Total Bilirubin 0.9 0.2 - 1.2 mg/dL   Alkaline phosphatase (APISO) 70 40 - 115 U/L   AST 16 10 - 40 U/L   ALT 11 9 - 46 U/L  CBC with Differential/Platelet  Result Value Ref Range   WBC 5.2 3.8 - 10.8 Thousand/uL   RBC 5.09 4.20 - 5.80 Million/uL   Hemoglobin 15.8 13.2 - 17.1 g/dL   HCT 45.0 38.5 - 50.0 %   MCV 88.4 80.0 - 100.0 fL   MCH 31.0 27.0 - 33.0 pg   MCHC 35.1 32.0 - 36.0 g/dL   RDW 12.0 11.0 - 15.0 %   Platelets 209 140 - 400 Thousand/uL   MPV 10.1 7.5 - 12.5 fL   Neutro Abs 2,642 1,500 - 7,800 cells/uL   Lymphs Abs 1,908 850 - 3,900 cells/uL    WBC mixed population 530 200 - 950 cells/uL   Eosinophils Absolute 68 15 - 500 cells/uL   Basophils Absolute 52 0 - 200 cells/uL  Neutrophils Relative % 50.8 %   Total Lymphocyte 36.7 %   Monocytes Relative 10.2 %   Eosinophils Relative 1.3 %   Basophils Relative 1.0 %  Lipid panel  Result Value Ref Range   Cholesterol 231 (H) <200 mg/dL   HDL 40 (L) >40 mg/dL   Triglycerides 135 <150 mg/dL   LDL Cholesterol (Calc) 164 (H) mg/dL (calc)   Total CHOL/HDL Ratio 5.8 (H) <5.0 (calc)   Non-HDL Cholesterol (Calc) 191 (H) <130 mg/dL (calc)  Hepatitis C antibody  Result Value Ref Range   Hepatitis C Ab NON-REACTIVE NON-REACTI   SIGNAL TO CUT-OFF 0.02 <1.00   Dg Lumbar Spine Complete  Result Date: 10/28/2018 CLINICAL DATA:  38 year old male with left leg pain after lifting a heavy box. EXAM: LUMBAR SPINE - COMPLETE 4+ VIEW COMPARISON:  None. FINDINGS: There is no acute fracture or subluxation of the lumbar spine. The vertebral body heights and disc spaces are maintained. The visualized posterior elements are intact. The soft tissues are unremarkable. IMPRESSION: No acute/traumatic lumbar spine pathology. Electronically Signed   By: Anner Crete M.D.   On: 10/28/2018 02:51    Procedures Procedures (including critical care time)  Medications Ordered in ED Medications  acetaminophen (TYLENOL) tablet 1,000 mg (1,000 mg Oral Given 10/28/18 0243)  naproxen (NAPROSYN) tablet 500 mg (500 mg Oral Given 10/28/18 0243)      Final Clinical Impressions(s) / ED Diagnoses   Return for intractable cough, coughing up blood,fevers >100.4 unrelieved by medication, shortness of breath, intractable vomiting, chest pain, shortness of breath, weakness,numbness, changes in speech, facial asymmetry,abdominal pain, passing out,Inability to tolerate liquids or food, cough, altered mental status or any concerns. No signs of systemic illness or infection. The patient is nontoxic-appearing on exam and  vital signs are within normal limits.   I have reviewed the triage vital signs and the nursing notes. Pertinent labs &imaging results that were available during my care of the patient were reviewed by me and considered in my medical decision making (see chart for details).  After history, exam, and medical workup I feel the patient has been appropriately medically screened and is safe for discharge home. Pertinent diagnoses were discussed with the patient. Patient was given return precautions   Lyrique Hakim, MD 10/28/18 2094

## 2019-02-16 ENCOUNTER — Other Ambulatory Visit: Payer: Self-pay | Admitting: Family Medicine

## 2019-02-16 DIAGNOSIS — F325 Major depressive disorder, single episode, in full remission: Secondary | ICD-10-CM

## 2019-02-16 DIAGNOSIS — F411 Generalized anxiety disorder: Secondary | ICD-10-CM

## 2019-02-16 NOTE — Telephone Encounter (Signed)
Requested medication (s) are due for refill today: no  Requested medication (s) are on the active medication list: no  Last refill:  01/15/2019  Future visit scheduled: no  Notes to clinic:  Medication has been discontinued    Requested Prescriptions  Pending Prescriptions Disp Refills   busPIRone (BUSPAR) 5 MG tablet [Pharmacy Med Name: BUSPIRONE HCL 5 MG TABLET] 30 tablet 5    Sig: Take 1 tablet (5 mg total) by mouth daily as needed.     Psychiatry: Anxiolytics/Hypnotics - Non-controlled Failed - 02/16/2019  1:11 AM      Failed - Valid encounter within last 6 months    Recent Outpatient Visits          1 year ago Encounter for routine history and physical exam for male   [facility] Steele Sizer, MD   1 year ago Major depression in remission [facility])   [facility] Steele Sizer, MD   3 years ago Perennial allergic rhinitis with seasonal variation   [facility] Steele Sizer, MD   3 years ago OCD (obsessive compulsive disorder)   [facility] Steele Sizer, MD   3 years ago Major depressive disorder, single episode, moderate [facility])   [facility] Steele Sizer, MD

## 2019-02-17 NOTE — Telephone Encounter (Signed)
lvm for scheduling °

## 2019-04-18 ENCOUNTER — Emergency Department: Payer: Managed Care, Other (non HMO)

## 2019-04-18 ENCOUNTER — Emergency Department
Admission: EM | Admit: 2019-04-18 | Discharge: 2019-04-18 | Disposition: A | Discharge status: Home / Self Care | Payer: Managed Care, Other (non HMO) | Attending: Emergency Medicine | Admitting: Emergency Medicine

## 2019-04-18 ENCOUNTER — Encounter: Payer: Self-pay | Admitting: Emergency Medicine

## 2019-04-18 ENCOUNTER — Other Ambulatory Visit: Payer: Self-pay

## 2019-04-18 DIAGNOSIS — R109 Unspecified abdominal pain: Secondary | ICD-10-CM | POA: Diagnosis present

## 2019-04-18 DIAGNOSIS — N200 Calculus of kidney: Secondary | ICD-10-CM | POA: Diagnosis not present

## 2019-04-18 LAB — URINALYSIS, COMPLETE (UACMP) WITH MICROSCOPIC
Bilirubin Urine: NEGATIVE
Glucose, UA: NEGATIVE mg/dL
Ketones, ur: NEGATIVE mg/dL
Leukocytes,Ua: NEGATIVE
Nitrite: NEGATIVE
Protein, ur: 30 mg/dL — AB
RBC / HPF: >50 RBC/hpf — ABNORMAL HIGH (ref 0–5)
Specific Gravity, Urine: 1.024 (ref 1.005–1.030)
pH: 5 (ref 5.0–8.0)

## 2019-04-18 LAB — BASIC METABOLIC PANEL
Anion gap: 8 (ref 5–15)
BUN: 18 mg/dL (ref 6–20)
CO2: 29 mmol/L (ref 22–32)
Calcium: 8.9 mg/dL (ref 8.9–10.3)
Chloride: 103 mmol/L (ref 98–111)
Creatinine, Ser: 0.9 mg/dL (ref 0.61–1.24)
GFR calc Af Amer: >60 mL/min (ref >60)
GFR calc non Af Amer: >60 mL/min (ref >60)
Glucose, Bld: 106 mg/dL — ABNORMAL HIGH (ref 70–99)
Potassium: 4 mmol/L (ref 3.5–5.1)
Sodium: 140 mmol/L (ref 135–145)

## 2019-04-18 LAB — CBC
HCT: 44.2 % (ref 39.0–52.0)
Hemoglobin: 15.2 g/dL (ref 13.0–17.0)
MCH: 30.3 pg (ref 26.0–34.0)
MCHC: 34.4 g/dL (ref 30.0–36.0)
MCV: 88.2 fL (ref 80.0–100.0)
Platelets: 202 10*3/uL (ref 150–400)
RBC: 5.01 MIL/uL (ref 4.22–5.81)
RDW: 11.9 % (ref 11.5–15.5)
WBC: 6.6 10*3/uL (ref 4.0–10.5)
nRBC: 0 % (ref 0.0–0.2)

## 2019-04-18 MED ORDER — TAMSULOSIN HCL 0.4 MG PO CAPS: 0.4000 mg | ORAL_CAPSULE | Freq: Every day | ORAL | 0 refills | Status: DC

## 2019-04-18 MED ORDER — OXYCODONE-ACETAMINOPHEN 5-325 MG PO TABS: 1.0000 | ORAL_TABLET | Freq: Four times a day (QID) | ORAL | 0 refills | Status: DC | PRN

## 2019-04-18 MED ORDER — ONDANSETRON HCL 4 MG/2ML IJ SOLN
4.0000 mg | Freq: Once | INTRAMUSCULAR | Status: AC
  Administered 2019-04-18: 4 mg via INTRAVENOUS
  Filled 2019-04-18: qty 2

## 2019-04-18 MED ORDER — ONDANSETRON 4 MG PO TBDP: 4.0000 mg | ORAL_TABLET | Freq: Three times a day (TID) | ORAL | 0 refills | Status: DC | PRN

## 2019-04-18 MED ORDER — KETOROLAC TROMETHAMINE 30 MG/ML IJ SOLN
30.0000 mg | Freq: Once | INTRAMUSCULAR | Status: AC
  Administered 2019-04-18: 30 mg via INTRAVENOUS
  Filled 2019-04-18: qty 1

## 2019-04-18 MED ORDER — FENTANYL CITRATE (PF) 100 MCG/2ML IJ SOLN
50.0000 ug | INTRAMUSCULAR | Status: DC | PRN
  Administered 2019-04-18: 13:00:00 50 ug via INTRAVENOUS
  Filled 2019-04-18: qty 2

## 2019-04-18 NOTE — ED Notes (Signed)
Pt discharged home after verbalizing understanding of discharge instructions; nad noted. 

## 2019-04-18 NOTE — ED Triage Notes (Signed)
FIRST NURSE NOTE- here for right flank pain. No hx kidney stones. No vomiting or fever.

## 2019-04-18 NOTE — ED Provider Notes (Signed)
[facility] Emergency Department Provider Note  ____________________________________________   First MD Initiated Contact with Patient 04/18/19 1341     (approximate)  I have reviewed the triage vital signs and the nursing notes.   HISTORY  Chief Complaint Flank Pain and Emesis   HPI Ronald Hale is a 39 y.o. male presents to the ED with sudden onset of right sided flank pain which caused him to vomit once.  Patient reports chills with his back pain.  He states that at this time the pain is traveling from the right flank into the anterior groin area.  He denies any gross hematuria.  He has never had a kidney stone before.  Patient was given fentanyl and Zofran while waiting to be seen and states that his pain is much better.  In triage his pain was rated as an 8 out of 10 and has decreased remarkably since then.       Past Medical History:  Diagnosis Date  . Allergy   . GERD (gastroesophageal reflux disease)   . Left arm pain   . Wheezing     Patient Active Problem List   Diagnosis Date Noted  . Varicocele 04/21/2017  . Venous insufficiency 03/13/2017  . Varicose veins of leg with pain, right 03/13/2017  . Insomnia 06/12/2015  . GAD (generalized anxiety disorder) 05/15/2015  . Major depression in remission ([place]) 05/15/2015  . H. pylori infection 04/12/2015  . OCD (obsessive compulsive disorder) 03/23/2015  . Perennial allergic rhinitis with seasonal variation 08/23/2008    Past Surgical History:  Procedure Laterality Date  . ENDOVENOUS ABLATION SAPHENOUS VEIN W/ LASER Right 09/27/2017   Dr. Hurman Horn    Prior to Admission medications   Medication Sig Start Date End Date Taking? Authorizing Provider  ondansetron (ZOFRAN ODT) 4 MG disintegrating tablet Take 1 tablet (4 mg total) by mouth every 8 (eight) hours as needed for nausea or vomiting. 04/18/19   Johnn Hai, PA-C  oxyCODONE-acetaminophen (PERCOCET) 5-325 MG tablet  Take 1-2 tablets by mouth every 6 (six) hours as needed for severe pain. 04/18/19 04/17/20  Johnn Hai, PA-C  tamsulosin (FLOMAX) 0.4 MG CAPS capsule Take 1 capsule (0.4 mg total) by mouth daily. 04/18/19   Johnn Hai, PA-C    Allergies Patient has no known allergies.  No family history on file.  Social History Social History   Tobacco Use  . Smoking status: Never Smoker  . Smokeless tobacco: Never Used  Substance Use Topics  . Alcohol use: No    Alcohol/week: 0.0 standard drinks  . Drug use: No    Review of Systems Constitutional: No fever/occasional chills Eyes: No visual changes. ENT: No sore throat. Cardiovascular: Denies chest pain. Respiratory: Denies shortness of breath. Gastrointestinal: No abdominal pain.  Positive nausea, positive vomiting.  No diarrhea.  No constipation. Genitourinary: Negative for dysuria.  Positive for right flank pain. Musculoskeletal: Negative for back pain. Skin: Negative for rash. Neurological: Negative for headaches, focal weakness or numbness. ____________________________________________   PHYSICAL EXAM:  VITAL SIGNS: ED Triage Vitals  Enc Vitals Group     BP 04/18/19 1228 125/79     Pulse Rate 04/18/19 1228 75     Resp 04/18/19 1228 16     Temp 04/18/19 1228 97.7 F (36.5 C)     Temp Source 04/18/19 1228 Oral     SpO2 04/18/19 1228 100 %     Weight 04/18/19 1225 185 lb (83.9 kg)  Height 04/18/19 1225 6\' 2"  (1.88 m)     Head Circumference --      Peak Flow --      Pain Score 04/18/19 1224 8     Pain Loc --      Pain Edu? --      Excl. in [place]? --    Constitutional: Alert and oriented. Well appearing and in no acute distress.  Patient is resting on the stretcher with decreased pain.  Denies any nausea at this time. Eyes: Conjunctivae are normal.  Head: Atraumatic. Neck: No stridor.   Cardiovascular: Normal rate, regular rhythm. Grossly normal heart sounds.  Good peripheral circulation. Respiratory: Normal  respiratory effort.  No retractions. Lungs CTAB. Gastrointestinal: Soft and nontender. No distention.  Musculoskeletal: His upper and lower extremities without any difficulty.  Normal gait was noted. Neurologic:  Normal speech and language. No gross focal neurologic deficits are appreciated. No gait instability. Skin:  Skin is warm, dry and intact. No rash noted. Psychiatric: Mood and affect are normal. Speech and behavior are normal.  ____________________________________________   LABS (all labs ordered are listed, but only abnormal results are displayed)  Labs Reviewed  URINALYSIS, COMPLETE (UACMP) WITH MICROSCOPIC - Abnormal; Notable for the following components:      Result Value   Color, Urine YELLOW (*)    APPearance HAZY (*)    Hgb urine dipstick LARGE (*)    Protein, ur 30 (*)    RBC / HPF >50 (*)    Bacteria, UA RARE (*)    All other components within normal limits  BASIC METABOLIC PANEL - Abnormal; Notable for the following components:   Glucose, Bld 106 (*)    All other components within normal limits  CBC    RADIOLOGY  Official radiology report(s): CT Renal Stone Study  Result Date: 04/18/2019 CLINICAL DATA:  Right flank pain EXAM: CT ABDOMEN AND PELVIS WITHOUT CONTRAST TECHNIQUE: Multidetector CT imaging of the abdomen and pelvis was performed following the standard protocol without oral or IV contrast. COMPARISON:  None. FINDINGS: Lower chest: Lung bases are clear. Hepatobiliary: No focal liver lesions are evident on this noncontrast enhanced study. Gallbladder wall is not appreciably thickened. There is no biliary duct dilatation. Pancreas: There is no pancreatic mass or inflammatory focus. Spleen: No splenic lesions are evident. Adrenals/Urinary Tract: Adrenals bilaterally appear normal. There is no evident renal mass or appreciable hydronephrosis on either side. There are three 1 mm calculi in the upper pole left kidney. There is a 1 mm calculus at the right  ureterovesical junction. No other ureteral calculi evident. Urinary bladder is midline with wall thickness within normal limits. Stomach/Bowel: There is no appreciable bowel wall or mesenteric thickening. There is no evident bowel obstruction. Terminal ileum appears normal. There is no evident free air or portal venous air. Vascular/Lymphatic: No abdominal aortic aneurysm. No vascular lesions are evident this noncontrast enhanced study. There is no adenopathy in the abdomen or pelvis. Reproductive: Prostate and seminal vesicles are normal in size and contour. No evident pelvic mass. Other: Appendix appears normal. There is no abscess or ascites in the abdomen or pelvis. Musculoskeletal: There are no blastic or lytic bone lesions. IMPRESSION: 1. 1 mm calculus at the right ureterovesical junction without hydronephrosis. 2. There are three 1 mm nonobstructing calculi in the upper pole left kidney. 3. No bowel obstruction. No abscess in the abdomen or pelvis. Appendix appears normal. Electronically Signed   By: Lowella Grip III M.D.   On: 04/18/2019  14:15    ____________________________________________   PROCEDURES  Procedure(s) performed (including Critical Care):  Procedures  ____________________________________________   INITIAL IMPRESSION / ASSESSMENT AND PLAN / ED COURSE  As part of my medical decision making, I reviewed the following data within the electronic MEDICAL RECORD NUMBER Notes from prior ED visits and [place] Controlled Substance Database Ronald Hale was evaluated in Emergency Department on 04/18/2019 for the symptoms described in the history of present illness. He was evaluated in the context of the global COVID-19 pandemic, which necessitated consideration that the patient might be at risk for infection with the SARS-CoV-2 virus that causes COVID-19. Institutional protocols and algorithms that pertain to the evaluation of patients at risk for COVID-19 are in a state of rapid change  based on information released by regulatory bodies including the CDC and federal and state organizations. These policies and algorithms were followed during the patient's care in the ED.  39 year old male presents to the ED with sudden onset of right flank pain with nausea and vomiting.  Patient states he has never had a kidney stone in the past.  Prior to being seen patient was given fentanyl and Zofran and states that he is feeling much better.  Urinalysis did show large amount of red blood cells with no bacteria.  CT scan confirmed that patient does have a 1 mm stone on the right.  He was also made aware that he has a very small renal calculi in the upper pole on the left.  No continued vomiting and patient states that his pain is under control.  He is to follow-up with his PCP if any continued problems and also the name of the urologist who is on-call was listed on his discharge papers should he need to continue with management.  Patient was discharged with a prescription for Percocet, Zofran ODT, and Flomax.  Patient is encouraged to increase fluids.  He is aware that he will return to the emergency department if any severe worsening of his symptoms. ____________________________________________   FINAL CLINICAL IMPRESSION(S) / ED DIAGNOSES  Final diagnoses:  Right kidney stone     ED Discharge Orders         Ordered    ondansetron (ZOFRAN ODT) 4 MG disintegrating tablet  Every 8 hours PRN     04/18/19 1445    oxyCODONE-acetaminophen (PERCOCET) 5-325 MG tablet  Every 6 hours PRN     04/18/19 1445    tamsulosin (FLOMAX) 0.4 MG CAPS capsule  Daily     04/18/19 1445           Note:  This document was prepared using Dragon voice recognition software and may include unintentional dictation errors.    Johnn Hai, PA-C 04/18/19 1529    Harvest Dark, MD 04/18/19 [phone]

## 2019-04-18 NOTE — ED Notes (Signed)
See triage note  Presents with sudden onset of flank pain with some n/v  Has had no hx of renal stones in past  Afebrile on arrival

## 2019-04-18 NOTE — Discharge Instructions (Signed)
Follow-up with your primary care provider and if any continued problems with kidney stone she can follow-up with Dr. Bernardo Heater who is the urologist on call today.  Begin taking medication as directed.  The Percocet was sent to the pharmacy and should be taken for pain 1 or 2 tablets every 6 hours.  Flomax is the medication that you will take once daily and Zofran if needed for nausea.  Increase fluids.

## 2019-04-18 NOTE — ED Triage Notes (Signed)
Pt to ED via POV c/o sudden onset right flank pain. Pt states that he has vomited x 1. Pt reports chills as well. Pt denies hx/o kidney stones. Pt appears uncomfortable.

## 2019-04-21 ENCOUNTER — Ambulatory Visit (INDEPENDENT_AMBULATORY_CARE_PROVIDER_SITE_OTHER): Payer: Managed Care, Other (non HMO) | Admitting: Family Medicine

## 2019-04-21 ENCOUNTER — Encounter: Payer: Self-pay | Admitting: Family Medicine

## 2019-04-21 ENCOUNTER — Other Ambulatory Visit: Payer: Self-pay

## 2019-04-21 VITALS — BP 114/78 | HR 96 | Temp 97.5°F | Resp 16 | Ht 73.0 in | Wt 187.1 lb

## 2019-04-21 DIAGNOSIS — F325 Major depressive disorder, single episode, in full remission: Secondary | ICD-10-CM | POA: Diagnosis not present

## 2019-04-21 DIAGNOSIS — F411 Generalized anxiety disorder: Secondary | ICD-10-CM | POA: Diagnosis not present

## 2019-04-21 DIAGNOSIS — F429 Obsessive-compulsive disorder, unspecified: Secondary | ICD-10-CM

## 2019-04-21 DIAGNOSIS — N200 Calculus of kidney: Secondary | ICD-10-CM

## 2019-04-21 DIAGNOSIS — M25562 Pain in left knee: Secondary | ICD-10-CM

## 2019-04-21 DIAGNOSIS — E538 Deficiency of other specified B group vitamins: Secondary | ICD-10-CM | POA: Diagnosis not present

## 2019-04-21 MED ORDER — BUSPIRONE HCL 5 MG PO TABS: 5.0000 mg | ORAL_TABLET | Freq: Two times a day (BID) | ORAL | 1 refills | Status: DC

## 2019-04-21 NOTE — Progress Notes (Signed)
Name: Ronald Hale   MRN: [id number]    DOB: 1980-06-18   Date:04/21/2019       Progress Note  Subjective  Chief Complaint  Chief Complaint  Patient presents with  . Medication Refill  . Depression  . Anxiety    HPI  Anxiety/Depression/OCD:  does not feel depressed, but still anxious and has OCD. He got a rx of Buspar 7.5 mg from Urgent Care and has been taking a forth prn, but was getting  nauseated and lightheaded so we went down to 5 mg and he is tolerating medication better, advised to try taking it bid to see if helps even more with symptoms and he is willing to try. He stopped Zoloft because it was not working. He never started Lexapro  Kidney stone: he states he developed acute onset of right flank pain, sharp and intense, nausea, vomiting, went to [facility] had CT stone search and localized a stone on left upper pole but on the right side it was on the ureterovesical junction, he was given pain medication, nausea medication and time off, however pain is unchanged even with medication, explained that he needs to see Urologist and we will place referral now  Left anterior knee pain: he states he injured his right knee a few years ago when playing football, he hyperextended his left knee, it got very swollen and eventually pain improved but he states now he is tired of having recurrent pain and instability and is ready to see Ortho. He still has intermittent effusion but not sure if gets red of warm  Patient Active Problem List   Diagnosis Date Noted  . Varicocele 04/21/2017  . Venous insufficiency 03/13/2017  . Varicose veins of leg with pain, right 03/13/2017  . Insomnia 06/12/2015  . GAD (generalized anxiety disorder) 05/15/2015  . Major depression in remission ([place]) 05/15/2015  . H. pylori infection 04/12/2015  . OCD (obsessive compulsive disorder) 03/23/2015  . Perennial allergic rhinitis with seasonal variation 08/23/2008    Past Surgical History:  Procedure Laterality Date   . ENDOVENOUS ABLATION SAPHENOUS VEIN W/ LASER Right 09/27/2017   Dr. Hurman Horn    History reviewed. No pertinent family history.    Current Outpatient Medications:  .  busPIRone (BUSPAR) 5 MG tablet, Take 5 mg by mouth daily as needed., Disp: , Rfl:  .  orphenadrine (NORFLEX) 100 MG tablet, Take 100 mg by mouth 2 (two) times daily., Disp: , Rfl:  .  oxyCODONE-acetaminophen (PERCOCET) 5-325 MG tablet, Take 1-2 tablets by mouth every 6 (six) hours as needed for severe pain., Disp: 20 tablet, Rfl: 0 .  predniSONE (DELTASONE) 20 MG tablet, Take 60 mg by mouth daily., Disp: , Rfl:  .  tamsulosin (FLOMAX) 0.4 MG CAPS capsule, Take 1 capsule (0.4 mg total) by mouth daily., Disp: 30 capsule, Rfl: 0 .  traMADol (ULTRAM) 50 MG tablet, Take 50 mg by mouth 4 (four) times daily as needed., Disp: , Rfl:  .  ondansetron (ZOFRAN ODT) 4 MG disintegrating tablet, Take 1 tablet (4 mg total) by mouth every 8 (eight) hours as needed for nausea or vomiting. (Patient not taking: Reported on 04/21/2019), Disp: 15 tablet, Rfl: 0  No Known Allergies  I personally reviewed active problem list, medication list, allergies, family history, social history, health maintenance with the patient/caregiver today.   ROS  Constitutional: Negative for fever or weight change.  Respiratory: Negative for cough and shortness of breath.   Cardiovascular: Negative for chest pain or palpitations.  Gastrointestinal: Negative for abdominal pain, no bowel changes.  Musculoskeletal: Positive for gait problem and intermittent left  joint swelling.  Skin: Negative for rash.  Neurological: Negative for dizziness or headache.  No other specific complaints in a complete review of systems (except as listed in HPI above).  Objective  Vitals:   04/21/19 1039  BP: 114/78  Pulse: 96  Resp: 16  Temp: (!) 97.5 F (36.4 C)  TempSrc: Temporal  SpO2: 96%  Weight: 187 lb 1.6 oz (84.9 kg)  Height: 6\' 1"  (1.854 m)    Body mass  index is 24.68 kg/m.  Physical Exam  Constitutional: Patient appears well-developed and well-nourished. No distress.  HEENT: head atraumatic, normocephalic, pupils equal and reactive to light Cardiovascular: Normal rate, regular rhythm and normal heart sounds.  No murmur heard. No BLE edema. Pulmonary/Chest: Effort normal and breath sounds normal. No respiratory distress. Abdominal: Soft.  There is no tenderness. Positive CVA tenderness on right side  Psychiatric: Patient has a normal mood and affect. behavior is normal. Judgment and thought content normal.  Recent Results (from the past 2160 hour(s))  Urinalysis, Complete w Microscopic     Status: Abnormal   Collection Time: 04/18/19 12:35 PM  Result Value Ref Range   Color, Urine YELLOW (A) YELLOW   APPearance HAZY (A) CLEAR   Specific Gravity, Urine 1.024 1.005 - 1.030   pH 5.0 5.0 - 8.0   Glucose, UA NEGATIVE NEGATIVE mg/dL   Hgb urine dipstick LARGE (A) NEGATIVE   Bilirubin Urine NEGATIVE NEGATIVE   Ketones, ur NEGATIVE NEGATIVE mg/dL   Protein, ur 30 (A) NEGATIVE mg/dL   Nitrite NEGATIVE NEGATIVE   Leukocytes,Ua NEGATIVE NEGATIVE   RBC / HPF >50 (H) 0 - 5 RBC/hpf   WBC, UA 0-5 0 - 5 WBC/hpf   Bacteria, UA RARE (A) NONE SEEN   Squamous Epithelial / LPF 0-5 0 - 5   Mucus PRESENT     Comment: Performed at [facility], [address] [place], [place] [id number]  Basic metabolic panel     Status: Abnormal   Collection Time: 04/18/19 12:35 PM  Result Value Ref Range   Sodium 140 135 - 145 mmol/L   Potassium 4.0 3.5 - 5.1 mmol/L   Chloride 103 98 - 111 mmol/L   CO2 29 22 - 32 mmol/L   Glucose, Bld 106 (H) 70 - 99 mg/dL   BUN 18 6 - 20 mg/dL   Creatinine, Ser 0.90 0.61 - 1.24 mg/dL   Calcium 8.9 8.9 - 10.3 mg/dL   GFR calc non Af Amer >60 >60 mL/min   GFR calc Af Amer >60 >60 mL/min   Anion gap 8 5 - 15    Comment: Performed at [facility], [address] [place],  [address]  CBC     Status:  None   Collection Time: 04/18/19 12:35 PM  Result Value Ref Range   WBC 6.6 4.0 - 10.5 K/uL   RBC 5.01 4.22 - 5.81 MIL/uL   Hemoglobin 15.2 13.0 - 17.0 g/dL   HCT 44.2 39.0 - 52.0 %   MCV 88.2 80.0 - 100.0 fL   MCH 30.3 26.0 - 34.0 pg   MCHC 34.4 30.0 - 36.0 g/dL   RDW 11.9 11.5 - 15.5 %   Platelets 202 150 - 400 K/uL   nRBC 0.0 0.0 - 0.2 %    Comment: Performed at [facility], [address] [place],  [address]    PHQ2/9:  Depression screen [facility] 2/9 04/21/2019 01/22/2018 12/15/2017 01/08/2016 12/29/2015  Decreased Interest 0 0 0 0 0  Down, Depressed, Hopeless 0 0 0 0 0  PHQ - 2 Score 0 0 0 0 0  Altered sleeping 2 0 0 - -  Tired, decreased energy 0 1 0 - -  Change in appetite 0 0 0 - -  Feeling bad or failure about yourself  0 0 0 - -  Trouble concentrating 0 0 0 - -  Moving slowly or fidgety/restless 0 0 0 - -  Suicidal thoughts 0 0 0 - -  PHQ-9 Score 2 1 0 - -  Difficult doing work/chores Not difficult at all Not difficult at all Not difficult at all - -    phq 9 is negative  GAD 7 : Generalized Anxiety Score 04/21/2019 01/22/2018 12/15/2017 03/23/2015  Nervous, Anxious, on Edge 3 1 0 3  Control/stop worrying 0 0 0 3  Worry too much - different things 1 0 0 2  Trouble relaxing 0 0 0 3  Restless 2 2 1 3   Easily annoyed or irritable 1 0 0 1  Afraid - awful might happen 0 0 0 1  Total GAD 7 Score 7 3 1 16   Anxiety Difficulty Not difficult at all Not difficult at all Not difficult at all Extremely difficult   Positive   Fall Risk: Fall Risk  04/21/2019 01/22/2018 12/15/2017 01/08/2016 12/29/2015  Falls in the past year? 0 No No No No  Number falls in past yr: 0 - - - -  Injury with Fall? 0 - - - -     Functional Status Survey: Is the patient deaf or have difficulty hearing?: No Does the patient have difficulty seeing, even when wearing glasses/contacts?: No Does the patient have difficulty concentrating, remembering, or making decisions?: No Does the  patient have difficulty walking or climbing stairs?: No Does the patient have difficulty dressing or bathing?: No Does the patient have difficulty doing errands alone such as visiting a doctor's office or shopping?: No    Assessment & Plan  1. Obsessive-compulsive disorder, unspecified type  - busPIRone (BUSPAR) 5 MG tablet; Take 1 tablet (5 mg total) by mouth 2 (two) times daily.  Dispense: 180 tablet; Refill: 1  2. Major depression in remission [facility])  Doing well   3. GAD (generalized anxiety disorder)  - busPIRone (BUSPAR) 5 MG tablet; Take 1 tablet (5 mg total) by mouth 2 (two) times daily.  Dispense: 180 tablet; Refill: 1  4. Low serum vitamin B12  Continue supplementation   5. Right kidney stone  - Ambulatory referral to Urology  6. Left anterior knee pain  - Ambulatory referral to Orthopedic Surgery

## 2019-04-23 ENCOUNTER — Telehealth: Payer: Self-pay | Admitting: Family Medicine

## 2019-04-23 NOTE — Telephone Encounter (Signed)
Copied from [place] [id number]. Topic: General - Other >> Apr 23, 2019  8:51 AM Keene Breath wrote: Reason for CRM: Patient would like an or of work note for his employer since he has not passed his stones yet.  Please advise and call patient to discuss at [phone]

## 2019-04-23 NOTE — Telephone Encounter (Signed)
Spoke with patient and is a new patient establishing care with Dr. Elenor Quinones on May 17, 2019.Please advise on writing letter since he is not able to be seen by early next week.

## 2019-04-26 NOTE — Telephone Encounter (Signed)
He has a 1 mm stone at the UVJ without hydronephrosis.  There is a >95% chance the stone will pass.  It looks like I have an available appointment opening at 1130 on 1/20 or any other provider that can see him this week

## 2019-04-26 NOTE — Telephone Encounter (Signed)
I have moved the app up and spoken with the patient he is aware of the change   [facility]

## 2019-04-28 ENCOUNTER — Other Ambulatory Visit: Payer: Self-pay

## 2019-04-28 ENCOUNTER — Encounter: Payer: Self-pay | Admitting: Urology

## 2019-04-28 ENCOUNTER — Ambulatory Visit (INDEPENDENT_AMBULATORY_CARE_PROVIDER_SITE_OTHER): Payer: Managed Care, Other (non HMO) | Admitting: Urology

## 2019-04-28 VITALS — BP 115/77 | HR 75 | Ht 74.0 in | Wt 183.0 lb

## 2019-04-28 DIAGNOSIS — N200 Calculus of kidney: Secondary | ICD-10-CM | POA: Diagnosis not present

## 2019-04-28 LAB — URINALYSIS, COMPLETE
Bilirubin, UA: NEGATIVE
Glucose, UA: NEGATIVE
Ketones, UA: NEGATIVE
Leukocytes,UA: NEGATIVE
Nitrite, UA: NEGATIVE
Protein,UA: NEGATIVE
RBC, UA: NEGATIVE
Specific Gravity, UA: >1.03 — ABNORMAL HIGH (ref 1.005–1.030)
Urobilinogen, Ur: 1 mg/dL (ref 0.2–1.0)
pH, UA: 6 (ref 5.0–7.5)

## 2019-04-28 LAB — MICROSCOPIC EXAMINATION
Bacteria, UA: NONE SEEN
Epithelial Cells (non renal): NONE SEEN /hpf (ref 0–10)
RBC, Urine: NONE SEEN /hpf (ref 0–2)

## 2019-04-28 NOTE — Progress Notes (Signed)
04/28/2019 1:48 PM   Ronald Hale 1980/12/04 [id number]  Referring provider: Steele Sizer, MD [address] [place],  [place] [address]  Chief Complaint  Patient presents with  . Nephrolithiasis    HPI: Ronald Hale is a 39 y.o. male who presented to the ED on 04/18/2019 with acute onset of right flank pain which radiated to the right groin region.  There were no identifiable precipitating, aggravating or alleviating factors.  Severity was rated 8/10.  He had nausea with one episode of emesis.  He reported chills but no fever. He was afebrile in the ED.  Pain significantly improved with parenteral analgesics.  A stone protocol CT was remarkable for three 1 mm nonobstructing left renal calculi and a 1 mm right UVJ calculus.  He has no previous history of stone disease.  He passed his stone last weekend and has been asymptomatic since that time.  No history of inflammatory bowel disease or gallop.   PMH: Past Medical History:  Diagnosis Date  . Allergy   . GERD (gastroesophageal reflux disease)   . Left arm pain   . Wheezing     Surgical History: Past Surgical History:  Procedure Laterality Date  . ENDOVENOUS ABLATION SAPHENOUS VEIN W/ LASER Right 09/27/2017   Dr. Hurman Horn    Home Medications:  Allergies as of 04/28/2019   No Known Allergies     Medication List       Accurate as of April 28, 2019  1:48 PM. If you have any questions, ask your nurse or doctor.        STOP taking these medications   ondansetron 4 MG disintegrating tablet Commonly known as: Zofran ODT Stopped by: Abbie Sons, MD   oxyCODONE-acetaminophen 5-325 MG tablet Commonly known as: Percocet Stopped by: Abbie Sons, MD   tamsulosin 0.4 MG Caps capsule Commonly known as: Flomax Stopped by: Abbie Sons, MD   traMADol 50 MG tablet Commonly known as: ULTRAM Stopped by: Abbie Sons, MD     TAKE these medications   busPIRone 5 MG  tablet Commonly known as: BUSPAR Take 1 tablet (5 mg total) by mouth 2 (two) times daily.   orphenadrine 100 MG tablet Commonly known as: NORFLEX Take 100 mg by mouth 2 (two) times daily.       Allergies: No Known Allergies  Family History: No family history on file.  Social History:  reports that he has never smoked. He has never used smokeless tobacco. He reports that he does not drink alcohol or use drugs.  ROS: UROLOGY Frequent Urination?: No Hard to postpone urination?: No Burning/pain with urination?: No Get up at night to urinate?: No Leakage of urine?: No Urine stream starts and stops?: No Trouble starting stream?: No Do you have to strain to urinate?: No Blood in urine?: No Urinary tract infection?: No Sexually transmitted disease?: No Injury to kidneys or bladder?: No Painful intercourse?: No Weak stream?: No Erection problems?: No Penile pain?: No  Gastrointestinal Nausea?: No Vomiting?: No Indigestion/heartburn?: No Diarrhea?: No Constipation?: No  Constitutional Fever: No Night sweats?: No Weight loss?: No Fatigue?: No  Skin Skin rash/lesions?: No Itching?: No  Eyes Blurred vision?: No Double vision?: No  Ears/Nose/Throat Sore throat?: No Sinus problems?: No  Hematologic/Lymphatic Swollen glands?: No Easy bruising?: No  Cardiovascular Leg swelling?: No Chest pain?: No  Respiratory Cough?: No Shortness of breath?: No  Endocrine Excessive thirst?: No  Musculoskeletal Back pain?: No Joint pain?: No  Neurological Headaches?: No Dizziness?: No  Psychologic Depression?: No Anxiety?: No  Physical Exam: BP 115/77   Pulse 75   Ht 6\' 2"  (1.88 m)   Wt 183 lb (83 kg)   BMI 23.50 kg/m   Constitutional:  Alert and oriented, No acute distress. HEENT: [place] AT, moist mucus membranes.  Trachea midline, no masses. Cardiovascular: No clubbing, cyanosis, or edema. Respiratory: Normal respiratory effort, no increased work of  breathing. Skin: No rashes, bruises or suspicious lesions. Neurologic: Grossly intact, no focal deficits, moving all 4 extremities. Psychiatric: Normal mood and affect.   Pertinent Imaging: Images were personally reviewed Results for orders placed during the hospital encounter of 04/18/19  CT Renal Stone Study   Narrative CLINICAL DATA:  Right flank pain  EXAM: CT ABDOMEN AND PELVIS WITHOUT CONTRAST  TECHNIQUE: Multidetector CT imaging of the abdomen and pelvis was performed following the standard protocol without oral or IV contrast.  COMPARISON:  None.  FINDINGS: Lower chest: Lung bases are clear.  Hepatobiliary: No focal liver lesions are evident on this noncontrast enhanced study. Gallbladder wall is not appreciably thickened. There is no biliary duct dilatation.  Pancreas: There is no pancreatic mass or inflammatory focus.  Spleen: No splenic lesions are evident.  Adrenals/Urinary Tract: Adrenals bilaterally appear normal. There is no evident renal mass or appreciable hydronephrosis on either side. There are three 1 mm calculi in the upper pole left kidney. There is a 1 mm calculus at the right ureterovesical junction. No other ureteral calculi evident. Urinary bladder is midline with wall thickness within normal limits.  Stomach/Bowel: There is no appreciable bowel wall or mesenteric thickening. There is no evident bowel obstruction. Terminal ileum appears normal. There is no evident free air or portal venous air.  Vascular/Lymphatic: No abdominal aortic aneurysm. No vascular lesions are evident this noncontrast enhanced study. There is no adenopathy in the abdomen or pelvis.  Reproductive: Prostate and seminal vesicles are normal in size and contour. No evident pelvic mass.  Other: Appendix appears normal. There is no abscess or ascites in the abdomen or pelvis.  Musculoskeletal: There are no blastic or lytic bone lesions.  IMPRESSION: 1. 1 mm calculus  at the right ureterovesical junction without hydronephrosis.  2. There are three 1 mm nonobstructing calculi in the upper pole left kidney.  3. No bowel obstruction. No abscess in the abdomen or pelvis. Appendix appears normal.   Electronically Signed   By: Lowella Grip III M.D.   On: 04/18/2019 14:15     Assessment & Plan:    - Nephrolithiasis He has 3 nonobstructing left renal calculi and a recently passed right distal ureteral calculus.  He is presently asymptomatic.  Due to the multiplicity of his stones I recommended to proceed with a metabolic evaluation to include a 24-hour urine and blood work.  He will be notified with results.  Follow-up KUB 4 months.   Abbie Sons, [place] [address] [place], [place] [address] [phone]

## 2019-04-28 NOTE — Telephone Encounter (Signed)
Patient is being seen today 04/28/19 at 11:30 a.m. by Abbie Sons, MD

## 2019-05-02 ENCOUNTER — Encounter: Payer: Self-pay | Admitting: Urology

## 2019-05-02 DIAGNOSIS — N200 Calculus of kidney: Secondary | ICD-10-CM | POA: Insufficient documentation

## 2019-05-17 ENCOUNTER — Ambulatory Visit: Payer: Managed Care, Other (non HMO) | Admitting: Urology

## 2019-06-11 ENCOUNTER — Encounter: Payer: Managed Care, Other (non HMO) | Admitting: Family Medicine

## 2019-07-10 ENCOUNTER — Ambulatory Visit: Payer: Managed Care, Other (non HMO) | Attending: Internal Medicine

## 2019-07-10 DIAGNOSIS — Z23 Encounter for immunization: Secondary | ICD-10-CM

## 2019-07-10 NOTE — Progress Notes (Signed)
   Covid-19 Vaccination Clinic  Name:  [name]    MRN: [id number] DOB: [birth year]  07/10/2019  [name] was observed post Covid-19 immunization for 15 minutes without incident. He was provided with Vaccine Information Sheet and instruction to access the V-Safe system.   [name] was instructed to call 911 with any severe reactions post vaccine: Marland Kitchen Difficulty breathing  . Swelling of face and throat  . A fast heartbeat  . A bad rash all over body  . Dizziness and weakness   Immunizations Administered    Name Date Dose VIS Date Route   Pfizer COVID-19 Vaccine 07/10/2019 10:25 AM 0.3 mL 03/19/2019 Intramuscular   Manufacturer: [place]   Lot: [phone] [id number]   [place]: [id number]

## 2019-07-31 ENCOUNTER — Ambulatory Visit: Payer: Managed Care, Other (non HMO) | Attending: Internal Medicine

## 2019-07-31 DIAGNOSIS — Z23 Encounter for immunization: Secondary | ICD-10-CM

## 2019-07-31 NOTE — Progress Notes (Signed)
   Covid-19 Vaccination Clinic  Name:  Ronald Hale    MRN: [id number] DOB: [birth year]  07/31/2019  Ronald Hale was observed post Covid-19 immunization for 15 minutes without incident. He was provided with Vaccine Information Sheet and instruction to access the V-Safe system.   Ronald Hale was instructed to call 911 with any severe reactions post vaccine: Marland Kitchen Difficulty breathing  . Swelling of face and throat  . A fast heartbeat  . A bad rash all over body  . Dizziness and weakness   Immunizations Administered    Name Date Dose VIS Date Route   Pfizer COVID-19 Vaccine 07/31/2019 10:11 AM 0.3 mL 06/02/2018 Intramuscular   Manufacturer: [employer]   Lot: [id number]   [place]: [id number]

## 2019-08-25 NOTE — Progress Notes (Incomplete)
   08/26/19 11:16 PM   Ronald Hale 09-Feb-1981 [id number]  Referring provider: Steele Sizer, MD [address] [place],  [place] [address] No chief complaint on file.   HPI: Ronald Hale is a 39 y.o. M who returns today for a 4 month f/u for the evaluation and management of nephrolithiasis.   -Stone protocol CT 04/18/19 remarkable for three 1 mm nonobstructing left renal calculi and a 1 mm right UVJ calculus. -Passed right distal ureteral calculus on previous visit  -KUB revealed     PMH: Past Medical History:  Diagnosis Date  . Allergy   . GERD (gastroesophageal reflux disease)   . Left arm pain   . Wheezing     Surgical History: Past Surgical History:  Procedure Laterality Date  . ENDOVENOUS ABLATION SAPHENOUS VEIN W/ LASER Right 09/27/2017   Dr. Hurman Horn    Home Medications:  Allergies as of 08/26/2019   No Known Allergies     Medication List       Accurate as of Aug 25, 2019 11:16 PM. If you have any questions, ask your nurse or doctor.        busPIRone 5 MG tablet Commonly known as: BUSPAR Take 1 tablet (5 mg total) by mouth 2 (two) times daily.   orphenadrine 100 MG tablet Commonly known as: NORFLEX Take 100 mg by mouth 2 (two) times daily.       Allergies: No Known Allergies  Family History: No family history on file.  Social History:  reports that he has never smoked. He has never used smokeless tobacco. He reports that he does not drink alcohol or use drugs.   Physical Exam: There were no vitals taken for this visit.  Constitutional:  Alert and oriented, No acute distress. HEENT: [place] AT, moist mucus membranes.  Trachea midline, no masses. Cardiovascular: No clubbing, cyanosis, or edema. Respiratory: Normal respiratory effort, no increased work of breathing. GI: Abdomen is soft, nontender, nondistended, no abdominal masses GU: No CVA tenderness Lymph: No cervical or inguinal lymphadenopathy. Skin: No rashes,  bruises or suspicious lesions. Neurologic: Grossly intact, no focal deficits, moving all 4 extremities. Psychiatric: Normal mood and affect.  Laboratory Data:  Lab Results  Component Value Date   CREATININE 0.90 04/18/2019   Urinalysis   Pertinent Imaging: ***   Assessment & Plan:     No follow-ups on file.  [facility] [address], [place], [place] [address] [phone]  I, Ronald Hale, am acting as a scribe for Dr. Nicki Reaper C. Stoioff,  {Add Media planner

## 2019-08-26 ENCOUNTER — Ambulatory Visit: Payer: Self-pay | Admitting: Urology

## 2019-09-29 ENCOUNTER — Other Ambulatory Visit: Payer: Self-pay

## 2019-09-29 ENCOUNTER — Encounter: Payer: Self-pay | Admitting: Emergency Medicine

## 2019-09-29 ENCOUNTER — Emergency Department: Payer: Managed Care, Other (non HMO)

## 2019-09-29 ENCOUNTER — Emergency Department
Admission: EM | Admit: 2019-09-29 | Discharge: 2019-09-29 | Disposition: A | Discharge status: Home / Self Care | Payer: Managed Care, Other (non HMO) | Attending: Emergency Medicine | Admitting: Emergency Medicine

## 2019-09-29 DIAGNOSIS — N200 Calculus of kidney: Secondary | ICD-10-CM | POA: Diagnosis not present

## 2019-09-29 DIAGNOSIS — Z79899 Other long term (current) drug therapy: Secondary | ICD-10-CM | POA: Insufficient documentation

## 2019-09-29 DIAGNOSIS — R109 Unspecified abdominal pain: Secondary | ICD-10-CM | POA: Diagnosis present

## 2019-09-29 LAB — CBC
HCT: 44.5 % (ref 39.0–52.0)
Hemoglobin: 15.6 g/dL (ref 13.0–17.0)
MCH: 31.1 pg (ref 26.0–34.0)
MCHC: 35.1 g/dL (ref 30.0–36.0)
MCV: 88.8 fL (ref 80.0–100.0)
Platelets: 185 10*3/uL (ref 150–400)
RBC: 5.01 MIL/uL (ref 4.22–5.81)
RDW: 12.1 % (ref 11.5–15.5)
WBC: 8.1 10*3/uL (ref 4.0–10.5)
nRBC: 0 % (ref 0.0–0.2)

## 2019-09-29 LAB — URINALYSIS, COMPLETE (UACMP) WITH MICROSCOPIC
Bacteria, UA: NONE SEEN
Bilirubin Urine: NEGATIVE
Glucose, UA: NEGATIVE mg/dL
Ketones, ur: NEGATIVE mg/dL
Leukocytes,Ua: NEGATIVE
Nitrite: NEGATIVE
Protein, ur: 30 mg/dL — AB
RBC / HPF: >50 RBC/hpf — ABNORMAL HIGH (ref 0–5)
Specific Gravity, Urine: 1.029 (ref 1.005–1.030)
pH: 5 (ref 5.0–8.0)

## 2019-09-29 LAB — BASIC METABOLIC PANEL
Anion gap: 9 (ref 5–15)
BUN: 17 mg/dL (ref 6–20)
CO2: 27 mmol/L (ref 22–32)
Calcium: 8.8 mg/dL — ABNORMAL LOW (ref 8.9–10.3)
Chloride: 105 mmol/L (ref 98–111)
Creatinine, Ser: 1.01 mg/dL (ref 0.61–1.24)
GFR calc Af Amer: >60 mL/min (ref >60)
GFR calc non Af Amer: >60 mL/min (ref >60)
Glucose, Bld: 145 mg/dL — ABNORMAL HIGH (ref 70–99)
Potassium: 4.1 mmol/L (ref 3.5–5.1)
Sodium: 141 mmol/L (ref 135–145)

## 2019-09-29 MED ORDER — ONDANSETRON HCL 4 MG/2ML IJ SOLN
4.0000 mg | Freq: Once | INTRAMUSCULAR | Status: AC
  Administered 2019-09-29: 4 mg via INTRAVENOUS
  Filled 2019-09-29: qty 2

## 2019-09-29 MED ORDER — ONDANSETRON 4 MG PO TBDP: 4.0000 mg | ORAL_TABLET | Freq: Three times a day (TID) | ORAL | 0 refills | Status: DC | PRN

## 2019-09-29 MED ORDER — TAMSULOSIN HCL 0.4 MG PO CAPS: 0.4000 mg | ORAL_CAPSULE | Freq: Every day | ORAL | 0 refills | Status: DC

## 2019-09-29 MED ORDER — OXYCODONE-ACETAMINOPHEN 7.5-325 MG PO TABS: 1.0000 | ORAL_TABLET | Freq: Four times a day (QID) | ORAL | 0 refills | Status: DC | PRN

## 2019-09-29 MED ORDER — FENTANYL CITRATE (PF) 100 MCG/2ML IJ SOLN
50.0000 ug | INTRAMUSCULAR | Status: DC | PRN
  Administered 2019-09-29: 50 ug via INTRAVENOUS
  Filled 2019-09-29: qty 2

## 2019-09-29 NOTE — ED Notes (Signed)
See triage note  Presents with left flank pain with positive nausea

## 2019-09-29 NOTE — ED Provider Notes (Signed)
[facility] Emergency Department Provider Note  ____________________________________________   First MD Initiated Contact with Patient 09/29/19 1224     (approximate)  I have reviewed the triage vital signs and the nursing notes.   HISTORY  Chief Complaint Flank Pain   HPI Ronald Hale is a 39 y.o. male presents to the ED with complaint of left flank pain and positive nausea.  Patient has history of kidney stones.  He denies any fever or chills.  His first kidney stone was in January 2021.  He was followed by a urologist and has had no problems until today.  At triage he rated his pain as 10/10.       Past Medical History:  Diagnosis Date  . Allergy   . GERD (gastroesophageal reflux disease)   . Left arm pain   . Wheezing     Patient Active Problem List   Diagnosis Date Noted  . Nephrolithiasis 05/02/2019  . Varicocele 04/21/2017  . Venous insufficiency 03/13/2017  . Varicose veins of leg with pain, right 03/13/2017  . Insomnia 06/12/2015  . GAD (generalized anxiety disorder) 05/15/2015  . Major depression in remission ([place]) 05/15/2015  . H. pylori infection 04/12/2015  . OCD (obsessive compulsive disorder) 03/23/2015  . Perennial allergic rhinitis with seasonal variation 08/23/2008    Past Surgical History:  Procedure Laterality Date  . ENDOVENOUS ABLATION SAPHENOUS VEIN W/ LASER Right 09/27/2017   Dr. Hurman Horn    Prior to Admission medications   Medication Sig Start Date End Date Taking? Authorizing Provider  busPIRone (BUSPAR) 5 MG tablet Take 1 tablet (5 mg total) by mouth 2 (two) times daily. 04/21/19   Steele Sizer, MD  ondansetron (ZOFRAN ODT) 4 MG disintegrating tablet Take 1 tablet (4 mg total) by mouth every 8 (eight) hours as needed for nausea or vomiting. 09/29/19   Johnn Hai, PA-C  orphenadrine (NORFLEX) 100 MG tablet Take 100 mg by mouth 2 (two) times daily. 03/16/19   [provider]    oxyCODONE-acetaminophen (PERCOCET) 7.5-325 MG tablet Take 1 tablet by mouth every 6 (six) hours as needed for severe pain. 09/29/19 09/28/20  Johnn Hai, PA-C  tamsulosin (FLOMAX) 0.4 MG CAPS capsule Take 1 capsule (0.4 mg total) by mouth daily. 09/29/19   Johnn Hai, PA-C    Allergies Patient has no known allergies.  History reviewed. No pertinent family history.  Social History Social History   Tobacco Use  . Smoking status: Never Smoker  . Smokeless tobacco: Never Used  Vaping Use  . Vaping Use: Never used  Substance Use Topics  . Alcohol use: No    Alcohol/week: 0.0 standard drinks  . Drug use: No    Review of Systems Constitutional: No fever/chills Eyes: No visual changes. ENT: No sore throat. Cardiovascular: Denies chest pain. Respiratory: Denies shortness of breath. Gastrointestinal: No abdominal pain.  Positive nausea, no vomiting. Genitourinary: Negative for dysuria. Musculoskeletal: Positive left flank pain. Skin: Negative for rash. Neurological: Negative for headaches, focal weakness or numbness.  ____________________________________________   PHYSICAL EXAM:  VITAL SIGNS: ED Triage Vitals  Enc Vitals Group     BP 09/29/19 1038 (!) 132/97     Pulse Rate 09/29/19 1038 (!) 55     Resp 09/29/19 1038 20     Temp --      Temp src --      SpO2 09/29/19 1038 100 %     Weight 09/29/19 1030 183 lb (83 kg)  Height 09/29/19 1030 6\' 2"  (1.88 m)     Head Circumference --      Peak Flow --      Pain Score 09/29/19 1030 10     Pain Loc --      Pain Edu? --      Excl. in [place]? --     Constitutional: Alert and oriented. Well appearing and in no acute distress. Eyes: Conjunctivae are normal. PERRL. EOMI. Head: Atraumatic. Neck: No stridor.   Cardiovascular: Normal rate, regular rhythm. Grossly normal heart sounds.  Good peripheral circulation. Respiratory: Normal respiratory effort.  No retractions. Lungs CTAB. Gastrointestinal: Soft and  nontender. No distention. Musculoskeletal: Ambulates without any assistance. Neurologic:  Normal speech and language. No gross focal neurologic deficits are appreciated. No gait instability. Skin:  Skin is warm, dry and intact. No rash noted. Psychiatric: Mood and affect are normal. Speech and behavior are normal.  ____________________________________________   LABS (all labs ordered are listed, but only abnormal results are displayed)  Labs Reviewed  URINALYSIS, COMPLETE (UACMP) WITH MICROSCOPIC - Abnormal; Notable for the following components:      Result Value   Color, Urine YELLOW (*)    APPearance CLOUDY (*)    Hgb urine dipstick LARGE (*)    Protein, ur 30 (*)    RBC / HPF >50 (*)    All other components within normal limits  BASIC METABOLIC PANEL - Abnormal; Notable for the following components:   Glucose, Bld 145 (*)    Calcium 8.8 (*)    All other components within normal limits  CBC    RADIOLOGY   Official radiology report(s): CT Renal Stone Study  Result Date: 09/29/2019 CLINICAL DATA:  Flank pain, suspected kidney stone EXAM: CT ABDOMEN AND PELVIS WITHOUT CONTRAST TECHNIQUE: Multidetector CT imaging of the abdomen and pelvis was performed following the standard protocol without IV contrast. COMPARISON:  April 18, 2019 FINDINGS: Lower chest: Lung bases are clear. Hepatobiliary: Limited imaging of the liver, incompletely imaged on today's study grossly normal. No pericholecystic stranding. Pancreas: Pancreas without signs of inflammation with normal size and contour. Spleen: Spleen normal size and contour. Adrenals/Urinary Tract: Adrenal glands are normal. No sign of hydronephrosis. Suspected distal LEFT ureteral calculus measures 1-2 mm and is associated with mild Peri ureteral stranding and minimal dilation of the LEFT ureter. No hydronephrosis. Mild perinephric stranding on the LEFT. Urinary bladder is under distended. Nephrolithiasis on the LEFT in the upper pole 2-3  mm. Stomach/Bowel: Mild mural stratification of the colon along the mid transverse colon. Normal appendix. Stomach is under distended.  No acute small bowel process. Vascular/Lymphatic: No significant atherosclerosis. No dilation of the abdominal aorta. No adenopathy in the retroperitoneum or upper abdomen. No pelvic adenopathy. Reproductive: Prostate unremarkable by CT. Phleboliths in periprostatic venous plexus bilaterally. Similar to prior study. Other: No free air.  No ascites. Musculoskeletal: No acute bone finding or destructive bone process. IMPRESSION: 1. Distal LEFT ureteral calculus measures 1-2 mm and is associated with mild ureteral distension and Peri ureteral stranding. There is mild distension of LEFT intrarenal collecting systems and renal pelvis without frank hydronephrosis currently. 2. Nephrolithiasis on the LEFT in the upper pole 2-3 mm. 3. Mild mural stratification of the colon along the mid transverse colon. This finding is nonspecific but can be seen in the setting of prior colitis given lack of inflammation. Correlate with any symptoms that would suggest ongoing mild inflammation. Electronically Signed   By: Zetta Bills M.D.   On:  09/29/2019 12:17    ____________________________________________   PROCEDURES  Procedure(s) performed (including Critical Care):  Procedures   ____________________________________________   INITIAL IMPRESSION / ASSESSMENT AND PLAN / ED COURSE  As part of my medical decision making, I reviewed the following data within the electronic MEDICAL RECORD NUMBER Notes from prior ED visits and  Controlled Substance Database  39 year old male presents to the ED with complaint of left flank pain.  Patient has had a history of kidney stones starting in January of this year.  When he arrived to the ED he was given fentanyl and Zofran and is more comfortable at this time.  He was made aware that he does have kidney stones in the left and that these are small  enough that he should pass them without any difficulty.  He is to follow-up with Dr. Erlene Quan if any continued problems.  A prescription for Zofran, Percocet and Flomax was sent to his pharmacy.  His return to the emergency department if any worsening of his symptoms or inability to take the medications.  ____________________________________________   FINAL CLINICAL IMPRESSION(S) / ED DIAGNOSES  Final diagnoses:  Nephrolithiasis  Left flank pain     ED Discharge Orders         Ordered    oxyCODONE-acetaminophen (PERCOCET) 7.5-325 MG tablet  Every 6 hours PRN     Discontinue  Reprint     09/29/19 1410    ondansetron (ZOFRAN ODT) 4 MG disintegrating tablet  Every 8 hours PRN     Discontinue  Reprint     09/29/19 1410    tamsulosin (FLOMAX) 0.4 MG CAPS capsule  Daily     Discontinue  Reprint     09/29/19 1410           Note:  This document was prepared using Dragon voice recognition software and may include unintentional dictation errors.    Johnn Hai, PA-C 09/29/19 1513    Vanessa [place], MD 09/30/19 1600

## 2019-09-29 NOTE — ED Triage Notes (Addendum)
Here for acute left flank pain with vomiting. Pain also in left abdomen. Hx kidney stones.  No fever. Appears in significant pain. UTO temp at this time

## 2019-09-29 NOTE — Discharge Instructions (Addendum)
Follow-up with your primary care provider if any continued problems.  Also you continue to have problems that are not improving or difficulty passing stone she will need to call and make an appointment with the urologist.  3 prescriptions were sent to your pharmacy.  Once for nausea, the Percocet is for pain and Flomax once daily.

## 2019-09-29 NOTE — ED Notes (Signed)
Unable to obtain temperature orally or axillary on patient.

## 2019-10-22 ENCOUNTER — Ambulatory Visit: Payer: Managed Care, Other (non HMO) | Admitting: Family Medicine

## 2019-11-30 NOTE — Progress Notes (Signed)
Name: Ronald Hale   MRN: [id number]    DOB: [birth year]   Date:12/01/2019       Progress Note  Subjective  Chief Complaint  Chief Complaint  Patient presents with  . Depression    He wants to change medication. Buspar not working for him.  . OCD  . Allergic Rhinitis     HPI  Anxiety/Depression/OCD:  does not feel depressed, but still anxious and has OCD. He got a rx of Buspar 7.5 mg from Urgent Care at the end of 2020  but was getting  nauseated and lightheaded so we changed to 5 mg and he was tolerating medication better but states getting much worse , OCD is affecting his marriage. [place] worried about getting exposed to poison ivy and gets very tense and nervous and it is not fun anybody. He is also not wiling to go out or socialize, feeling down and sad lately, phq 9 is up, he is also feeling more anxious and nervous. He never tried Lexapro but states stopped zoloft on his own because " did not work". He is wiling to see a psychiatrist now, asked if he wants to resume SSRI and he is wiling to try it . He has lost his appetite, it happens when he is depressed.   Kidney stone: changed his diet, no recent episodes   Left anterior knee pain: he states he injured his right knee a few years ago when playing football, he hyperextended his left knee, it got very swollen and eventually pain improved but because symptoms continue to cause problems going up and down stairs, pain when he squats. He saw Ortho and had x-ray also had steroid injection, he states not better , advised to go back and will likely have MRI    Patient Active Problem List   Diagnosis Date Noted  . Nephrolithiasis 05/02/2019  . Varicocele 04/21/2017  . Venous insufficiency 03/13/2017  . Varicose veins of leg with pain, right 03/13/2017  . Insomnia 06/12/2015  . GAD (generalized anxiety disorder) 05/15/2015  . Major depression in remission ([place]) 05/15/2015  . H. pylori  infection 04/12/2015  . OCD (obsessive compulsive disorder) 03/23/2015  . Perennial allergic rhinitis with seasonal variation 08/23/2008    Past Surgical History:  Procedure Laterality Date  . ENDOVENOUS ABLATION SAPHENOUS VEIN W/ LASER Right 09/27/2017   Dr. Hurman Horn    History reviewed. No pertinent family history.  Social History   Tobacco Use  . Smoking status: Never Smoker  . Smokeless tobacco: Never Used  Substance Use Topics  . Alcohol use: No    Alcohol/week: 0.0 standard drinks     Current Outpatient Medications:  .  busPIRone (BUSPAR) 5 MG tablet, Take 1 tablet (5 mg total) by mouth 2 (two) times daily., Disp: 180 tablet, Rfl: 1 .  sertraline (ZOLOFT) 50 MG tablet, 50 mg once daily (Patient not taking: Reported on 12/01/2019), Disp: , Rfl:   No Known Allergies  I personally reviewed active problem list, medication list, allergies, family history, social history, health maintenance with the patient/caregiver today.   ROS  Constitutional: Negative for fever or weight change.  Respiratory: Negative for cough and shortness of breath.   Cardiovascular: Negative for chest pain or palpitations.  Gastrointestinal: Negative for abdominal pain, no bowel changes.  Musculoskeletal: Negative for gait problem or joint swelling.  Skin: Negative for rash.  Neurological: Negative for dizziness or headache.  No other specific complaints  in a complete review of systems (except as listed in HPI above).  Objective   Vitals:   12/01/19 1152  BP: 118/90  Pulse: 84  Resp: 16  Temp: 98 F (36.7 C)  TempSrc: Oral  SpO2: 98%  Weight: 183 lb 11.2 oz (83.3 kg)  Height: 6\' 2"  (1.88 m)    Body mass index is 23.59 kg/m.  Physical Exam  Constitutional: Patient appears well-developed and well-nourished. No distress.  HEENT: head atraumatic, normocephalic, pupils equal and reactive to light, neck supple Cardiovascular: Normal rate, regular rhythm and normal heart  sounds.  No murmur heard. No BLE edema. Pulmonary/Chest: Effort normal and breath sounds normal. No respiratory distress. Abdominal: Soft.  There is no tenderness. Muscular skeletal: no effusion, normal rom knee Psychiatric: Patient has a normal mood and affect. behavior is normal. Judgment and thought content normal.  Recent Results (from the past 2160 hour(s))  Urinalysis, Complete w Microscopic     Status: Abnormal   Collection Time: 09/29/19 10:40 AM  Result Value Ref Range   Color, Urine YELLOW (A) YELLOW   APPearance CLOUDY (A) CLEAR   Specific Gravity, Urine 1.029 1.005 - 1.030   pH 5.0 5.0 - 8.0   Glucose, UA NEGATIVE NEGATIVE mg/dL   Hgb urine dipstick LARGE (A) NEGATIVE   Bilirubin Urine NEGATIVE NEGATIVE   Ketones, ur NEGATIVE NEGATIVE mg/dL   Protein, ur 30 (A) NEGATIVE mg/dL   Nitrite NEGATIVE NEGATIVE   Leukocytes,Ua NEGATIVE NEGATIVE   RBC / HPF >50 (H) 0 - 5 RBC/hpf   WBC, UA 0-5 0 - 5 WBC/hpf   Bacteria, UA NONE SEEN NONE SEEN   Squamous Epithelial / LPF 0-5 0 - 5   Mucus PRESENT    Budding Yeast PRESENT     Comment: Performed at [facility], [address], [place], [place] [address]  Basic metabolic panel     Status: Abnormal   Collection Time: 09/29/19 10:40 AM  Result Value Ref Range   Sodium 141 135 - 145 mmol/L   Potassium 4.1 3.5 - 5.1 mmol/L   Chloride 105 98 - 111 mmol/L   CO2 27 22 - 32 mmol/L   Glucose, Bld 145 (H) 70 - 99 mg/dL    Comment: Glucose reference range applies only to samples taken after fasting for at least 8 hours.   BUN 17 6 - 20 mg/dL   Creatinine, Ser 1.01 0.61 - 1.24 mg/dL   Calcium 8.8 (L) 8.9 - 10.3 mg/dL   GFR calc non Af Amer >60 >60 mL/min   GFR calc Af Amer >60 >60 mL/min   Anion gap 9 5 - 15    Comment: Performed at [facility], [address] [place], [place] [address]  CBC     Status: None   Collection Time: 09/29/19 10:40 AM  Result Value Ref Range   WBC 8.1 4.0 - 10.5 K/uL   RBC 5.01 4.22 -  5.81 MIL/uL   Hemoglobin 15.6 13.0 - 17.0 g/dL   HCT 44.5 39 - 52 %   MCV 88.8 80.0 - 100.0 fL   MCH 31.1 26.0 - 34.0 pg   MCHC 35.1 30.0 - 36.0 g/dL   RDW 12.1 11.5 - 15.5 %   Platelets 185 150 - 400 K/uL   nRBC 0.0 0.0 - 0.2 %    Comment: Performed at [facility], [address] [place], [place] [address]      PHQ2/9: Depression screen [place] 2/9 12/01/2019 04/21/2019 01/22/2018  12/15/2017 01/08/2016  Decreased Interest 2 0 0 0 0  Down, Depressed, Hopeless 2 0 0 0 0  PHQ - 2 Score 4 0 0 0 0  Altered sleeping 1 2 0 0 -  Tired, decreased energy 0 0 1 0 -  Change in appetite 1 0 0 0 -  Feeling bad or failure about yourself  2 0 0 0 -  Trouble concentrating 0 0 0 0 -  Moving slowly or fidgety/restless 0 0 0 0 -  Suicidal thoughts 0 0 0 0 -  PHQ-9 Score 8 2 1  0 -  Difficult doing work/chores Very difficult Not difficult at all Not difficult at all Not difficult at all -    phq 9 is positive  GAD 7 : Generalized Anxiety Score 04/21/2019 01/22/2018 12/15/2017 03/23/2015  Nervous, Anxious, on Edge 3 1 0 3  Control/stop worrying 0 0 0 3  Worry too much - different things 1 0 0 2  Trouble relaxing 0 0 0 3  Restless 2 2 1 3   Easily annoyed or irritable 1 0 0 1  Afraid - awful might happen 0 0 0 1  Total GAD 7 Score 7 3 1 16   Anxiety Difficulty Not difficult at all Not difficult at all Not difficult at all Extremely difficult   Positive   Fall Risk: Fall Risk  12/01/2019 04/21/2019 01/22/2018 12/15/2017 01/08/2016  Falls in the past year? 0 0 No No No  Number falls in past yr: 0 0 - - -  Injury with Fall? 0 0 - - -     Assessment & Plan  1. Obsessive-compulsive disorder, unspecified type  - Ambulatory referral to Psychiatry - escitalopram (LEXAPRO) 5 MG tablet; Take 1 tablet (5 mg total) by mouth daily.  Dispense: 30 tablet; Refill: 0  2. Mild recurrent major depression ([place])  - Ambulatory referral to Psychiatry - escitalopram (LEXAPRO) 5 MG tablet; Take 1 tablet (5 mg  total) by mouth daily.  Dispense: 30 tablet; Refill: 0  3. GAD (generalized anxiety disorder)  - Ambulatory referral to Psychiatry - escitalopram (LEXAPRO) 5 MG tablet; Take 1 tablet (5 mg total) by mouth daily.  Dispense: 30 tablet; Refill: 0  4. Low serum vitamin B12  - B12 and Folate Panel  5. Diabetes mellitus screening  - Hemoglobin A1c  6. Dyslipidemia  - Lipid panel  7. Long-term use of high-risk medication  - COMPLETE METABOLIC PANEL WITH GFR - CBC with Differential/Platelet  8. History of kidney stones

## 2019-12-01 ENCOUNTER — Ambulatory Visit (INDEPENDENT_AMBULATORY_CARE_PROVIDER_SITE_OTHER): Payer: Managed Care, Other (non HMO) | Admitting: Family Medicine

## 2019-12-01 ENCOUNTER — Encounter: Payer: Self-pay | Admitting: Family Medicine

## 2019-12-01 ENCOUNTER — Other Ambulatory Visit: Payer: Self-pay

## 2019-12-01 VITALS — BP 118/90 | HR 84 | Temp 98.0°F | Resp 16 | Ht 74.0 in | Wt 183.7 lb

## 2019-12-01 DIAGNOSIS — F411 Generalized anxiety disorder: Secondary | ICD-10-CM | POA: Diagnosis not present

## 2019-12-01 DIAGNOSIS — F429 Obsessive-compulsive disorder, unspecified: Secondary | ICD-10-CM | POA: Diagnosis not present

## 2019-12-01 DIAGNOSIS — F330 Major depressive disorder, recurrent, mild: Secondary | ICD-10-CM

## 2019-12-01 DIAGNOSIS — E538 Deficiency of other specified B group vitamins: Secondary | ICD-10-CM

## 2019-12-01 DIAGNOSIS — E785 Hyperlipidemia, unspecified: Secondary | ICD-10-CM

## 2019-12-01 DIAGNOSIS — Z79899 Other long term (current) drug therapy: Secondary | ICD-10-CM

## 2019-12-01 DIAGNOSIS — Z87442 Personal history of urinary calculi: Secondary | ICD-10-CM

## 2019-12-01 DIAGNOSIS — Z131 Encounter for screening for diabetes mellitus: Secondary | ICD-10-CM

## 2019-12-01 MED ORDER — ESCITALOPRAM OXALATE 5 MG PO TABS: 5.0000 mg | ORAL_TABLET | Freq: Every day | ORAL | 0 refills | Status: DC

## 2019-12-02 LAB — COMPLETE METABOLIC PANEL WITH GFR
AG Ratio: 2.3 (calc) (ref 1.0–2.5)
ALT: 11 U/L (ref 9–46)
AST: 15 U/L (ref 10–40)
Albumin: 4.8 g/dL (ref 3.6–5.1)
Alkaline phosphatase (APISO): 56 U/L (ref 36–130)
BUN: 13 mg/dL (ref 7–25)
CO2: 27 mmol/L (ref 20–32)
Calcium: 8.9 mg/dL (ref 8.6–10.3)
Chloride: 104 mmol/L (ref 98–110)
Creat: 0.84 mg/dL (ref 0.60–1.35)
GFR, Est African American: 129 mL/min/{1.73_m2} (ref >=60)
GFR, Est Non African American: 111 mL/min/{1.73_m2} (ref >=60)
Globulin: 2.1 g/dL (calc) (ref 1.9–3.7)
Glucose, Bld: 79 mg/dL (ref 65–99)
Potassium: 4.3 mmol/L (ref 3.5–5.3)
Sodium: 141 mmol/L (ref 135–146)
Total Bilirubin: 0.8 mg/dL (ref 0.2–1.2)
Total Protein: 6.9 g/dL (ref 6.1–8.1)

## 2019-12-02 LAB — CBC WITH DIFFERENTIAL/PLATELET
Absolute Monocytes: 448 cells/uL (ref 200–950)
Basophils Absolute: 39 cells/uL (ref 0–200)
Basophils Relative: 0.7 %
Eosinophils Absolute: 39 cells/uL (ref 15–500)
Eosinophils Relative: 0.7 %
HCT: 44.9 % (ref 38.5–50.0)
Hemoglobin: 15.2 g/dL (ref 13.2–17.1)
Lymphs Abs: 1495 cells/uL (ref 850–3900)
MCH: 30.6 pg (ref 27.0–33.0)
MCHC: 33.9 g/dL (ref 32.0–36.0)
MCV: 90.3 fL (ref 80.0–100.0)
MPV: 10 fL (ref 7.5–12.5)
Monocytes Relative: 8 %
Neutro Abs: 3578 cells/uL (ref 1500–7800)
Neutrophils Relative %: 63.9 %
Platelets: 233 10*3/uL (ref 140–400)
RBC: 4.97 10*6/uL (ref 4.20–5.80)
RDW: 11.9 % (ref 11.0–15.0)
Total Lymphocyte: 26.7 %
WBC: 5.6 10*3/uL (ref 3.8–10.8)

## 2019-12-02 LAB — HEMOGLOBIN A1C
Hgb A1c MFr Bld: 4.8 % of total Hgb (ref <5.7)
Mean Plasma Glucose: 91 (calc)
eAG (mmol/L): 5 (calc)

## 2019-12-02 LAB — LIPID PANEL
Cholesterol: 200 mg/dL — ABNORMAL HIGH (ref <200)
HDL: 37 mg/dL — ABNORMAL LOW (ref >=40)
LDL Cholesterol (Calc): 145 mg/dL (calc) — ABNORMAL HIGH
Non-HDL Cholesterol (Calc): 163 mg/dL (calc) — ABNORMAL HIGH (ref <130)
Total CHOL/HDL Ratio: 5.4 (calc) — ABNORMAL HIGH (ref <5.0)
Triglycerides: 78 mg/dL (ref <150)

## 2019-12-02 LAB — B12 AND FOLATE PANEL
Folate: 8.5 ng/mL
Vitamin B-12: 343 pg/mL (ref 200–1100)

## 2019-12-25 ENCOUNTER — Other Ambulatory Visit: Payer: Self-pay | Admitting: Family Medicine

## 2019-12-25 DIAGNOSIS — F411 Generalized anxiety disorder: Secondary | ICD-10-CM

## 2019-12-25 DIAGNOSIS — F330 Major depressive disorder, recurrent, mild: Secondary | ICD-10-CM

## 2019-12-25 DIAGNOSIS — F429 Obsessive-compulsive disorder, unspecified: Secondary | ICD-10-CM

## 2019-12-25 NOTE — Telephone Encounter (Signed)
Requested  medications are  due for refill today yes  Requested medications are on the active medication list yes  Last refill 8/25  Last visit 11/2019  Future visit scheduled no  Notes to clinic Rx written 3 weeks ago by Dr. Ancil Boozer was specifically for only 30 days, please assess.

## 2019-12-27 NOTE — Telephone Encounter (Signed)
No maam, but you did have a 3pm appt available. Tried to contact pt and had to leave voice message. Also informed on vm that this appt can be virtual

## 2020-01-05 ENCOUNTER — Other Ambulatory Visit: Payer: Self-pay | Admitting: Family Medicine

## 2020-01-05 DIAGNOSIS — F429 Obsessive-compulsive disorder, unspecified: Secondary | ICD-10-CM

## 2020-01-05 DIAGNOSIS — F330 Major depressive disorder, recurrent, mild: Secondary | ICD-10-CM

## 2020-01-05 DIAGNOSIS — F411 Generalized anxiety disorder: Secondary | ICD-10-CM

## 2020-01-05 NOTE — Telephone Encounter (Signed)
Pt was advised to schedule an appt for refill for escitalopram (LEXAPRO) 5 MG tablet /pt schedule next available in November but wanted to see if he can get a courtesy refill until his appt or if he can be seen sooner/ please advise

## 2020-01-06 ENCOUNTER — Other Ambulatory Visit: Payer: Self-pay | Admitting: Family Medicine

## 2020-01-06 DIAGNOSIS — F429 Obsessive-compulsive disorder, unspecified: Secondary | ICD-10-CM

## 2020-01-06 DIAGNOSIS — F411 Generalized anxiety disorder: Secondary | ICD-10-CM

## 2020-01-06 DIAGNOSIS — F330 Major depressive disorder, recurrent, mild: Secondary | ICD-10-CM

## 2020-01-06 MED ORDER — ESCITALOPRAM OXALATE 5 MG PO TABS: 5.0000 mg | ORAL_TABLET | Freq: Every day | ORAL | 1 refills | Status: DC

## 2020-01-31 ENCOUNTER — Telehealth: Payer: Self-pay

## 2020-01-31 ENCOUNTER — Encounter: Payer: Self-pay | Admitting: Psychiatry

## 2020-01-31 ENCOUNTER — Other Ambulatory Visit: Payer: Self-pay

## 2020-01-31 ENCOUNTER — Ambulatory Visit (INDEPENDENT_AMBULATORY_CARE_PROVIDER_SITE_OTHER): Payer: 59 | Admitting: Psychiatry

## 2020-01-31 DIAGNOSIS — F429 Obsessive-compulsive disorder, unspecified: Secondary | ICD-10-CM

## 2020-01-31 DIAGNOSIS — F411 Generalized anxiety disorder: Secondary | ICD-10-CM | POA: Diagnosis not present

## 2020-01-31 MED ORDER — ESCITALOPRAM OXALATE 10 MG PO TABS: 10.0000 mg | ORAL_TABLET | Freq: Every day | ORAL | 1 refills | Status: DC

## 2020-01-31 NOTE — Telephone Encounter (Signed)
Ronald Hale Obsessive Compulsive scale was mailed out to patient today. - pending

## 2020-01-31 NOTE — Progress Notes (Signed)
Virtual Visit via Video Note  I connected with [name] on 01/31/20 at 11:00 AM EDT by a video enabled telemedicine application and verified that I am speaking with the correct person using two identifiers.  Location Provider Location : [facility] Patient Location : Home  Participants: Patient , Provider   I discussed the limitations of evaluation and management by telemedicine and the availability of in person appointments. The patient expressed understanding and agreed to proceed.    I discussed the assessment and treatment plan with the patient. The patient was provided an opportunity to ask questions and all were answered. The patient agreed with the plan and demonstrated an understanding of the instructions.   The patient was advised to call back or seek an in-person evaluation if the symptoms worsen or if the condition fails to improve as anticipated.   Psychiatric Initial Adult Assessment   Patient Identification: Ronald Hale MRN:  [id number] Date of Evaluation:  01/31/2020 Referral Source: Dr.Sowles Chief Complaint:   Chief Complaint    Establish Care     Visit Diagnosis:    ICD-10-CM   1. Obsessive-compulsive disorder, unspecified type  F42.9 escitalopram (LEXAPRO) 10 MG tablet    TSH  2. GAD (generalized anxiety disorder)  F41.1 escitalopram (LEXAPRO) 10 MG tablet    TSH    History of Present Illness:  Ronald Hale is a 39 year old male, employed, married, has a history of anxiety, gastroesophageal reflux disease, left sided knee pain was evaluated by telemedicine today.  Patient reports he has been struggling with anxiety symptoms since the past several years.  Patient describes his anxiety symptoms as worrying about different things.  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  He hence tries to avoid any kind of activity outdoors since he is worried about having another outbreak of poison oak.  He also reports  anxiety attacks on and off when he has certain situational stressors.  He reports he had an anxiety attack a few months ago when he was driving his car when his vision got blurry, his breathing got deeper and heavier and he had to pull over.  Patient reports when he has these anxiety attacks it can last for hours.  That day his wife had to come and get him and take him back home.  Patient has not had such an attack recently.  He is currently on Lexapro for anxiety symptoms however it was recently started and hence he does not know if it is beneficial.  Patient does report that he struggles with compulsive behaviors like cleaning.  He reports he is worried about getting sick and is afraid of germs.  He reports he cleans his hands and wipes down things at home and this can take a lot of time.  He reports at work he wears gloves and hence it does not affect his work.  He however reports this obsessions and compulsions are affecting his marriage life.  He does not know how much time he takes to wash or how many times he does not.  He however reports it is significant enough to cause problems in his marriage.  Patient reports he works third shift four days a week.  The days that he sleeps during the day he only sleeps for 3 to 4 hours.  He reports he feels as though that is enough for him and he may go back to sleep for 2 hours before going to  work at night.  He reports the nights that he does not work he sleeps 7 to 8 hours and has no difficulty falling asleep or maintaining his sleep.  He is not interested in a sleep medication today.  Patient denies any suicidality, homicidality or perceptual disturbances.  Patient denies any manic or hypomanic symptoms.  Patient denies any history of trauma.  Patient denies any substance abuse problems.  Patient reports he was tried on BuSpar before however he had side effects to it.  He is currently on Lexapro 5 mg however ran out a few days ago.  He would like to get  back on the Lexapro.   Associated Signs/Symptoms: Depression Symptoms:  anxiety, disturbed sleep, (Hypo) Manic Symptoms:  Denies Anxiety Symptoms:  Excessive Worry, Obsessive Compulsive Symptoms:   Handwashing,, Psychotic Symptoms:  Denies PTSD Symptoms: Negative  Past Psychiatric History: Patient denies inpatient mental health admissions.  He did have 1 visit with Ms. Peacock at our clinic in 2017 for therapy.  Patient however did not continue therapy at that time.  He was recently started on medication by his primary care provider.  Patient denies suicide attempts.  Previous Psychotropic Medications: Yes BuSpar, Lexapro  Substance Abuse History in the last 12 months:  No.  Consequences of Substance Abuse: Negative  Past Medical History:  Past Medical History:  Diagnosis Date  . Allergy   . Anxiety   . GERD (gastroesophageal reflux disease)   . Left arm pain   . Wheezing     Past Surgical History:  Procedure Laterality Date  . ENDOVENOUS ABLATION SAPHENOUS VEIN W/ LASER Right 09/27/2017   Dr. Hurman Horn    Family Psychiatric History: Patient denies history of mental health problems in his family.  Family History:  Family History  Problem Relation Age of Onset  . Mental illness Neg Hx     Social History:   Social History   Socioeconomic History  . Marital status: Married    Spouse name: Araceli  . Number of children: 0  . Years of education: Not on file  . Highest education level: Associate degree: occupational, Hotel manager, or vocational program  Occupational History  . Occupation: Proofreader    Comment: Architect.   Tobacco Use  . Smoking status: Never Smoker  . Smokeless tobacco: Never Used  Vaping Use  . Vaping Use: Never used  Substance and Sexual Activity  . Alcohol use: No    Alcohol/week: 0.0 standard drinks  . Drug use: No  . Sexual activity: Yes    Partners: Female    Birth control/protection: None  Other Topics Concern  . Not on  file  Social History Narrative   Raised by grandparents since birth, no contact with parents, not much of family history available    Social Determinants of Health   Financial Resource Strain:   . Difficulty of Paying Living Expenses: Not on file  Food Insecurity:   . Worried About Charity fundraiser in the Last Year: Not on file  . Ran Out of Food in the Last Year: Not on file  Transportation Needs:   . Lack of Transportation (Medical): Not on file  . Lack of Transportation (Non-Medical): Not on file  Physical Activity:   . Days of Exercise per Week: Not on file  . Minutes of Exercise per Session: Not on file  Stress:   . Feeling of Stress : Not on file  Social Connections:   . Frequency of Communication with Friends and  Family: Not on file  . Frequency of Social Gatherings with Friends and Family: Not on file  . Attends Religious Services: Not on file  . Active Member of Clubs or Organizations: Not on file  . Attends Archivist Meetings: Not on file  . Marital Status: Not on file    Additional Social History: Patient was raised by maternal grandparents.  Patient did not know his father.  Patient reports his mother was too young and hence the rights were given over to his grandparents.  His grandfather passed away when he was 44 years old and thereafter he was raised by his grandmother.  He continued to have a good relationship with his grandmother.  Patient graduated high school and has an associate degree from [employer].  Patient currently works at [employer].  He is married since the past 10 years.  He denies having children.  He denies any legal problems.  He currently lives in [place].  Allergies:   Allergies  Allergen Reactions  . Poison Oak Extract     Metabolic Disorder Labs: Lab Results  Component Value Date   HGBA1C 4.8 12/01/2019   MPG 91 12/01/2019   MPG 91 01/22/2018   No results found for: PROLACTIN Lab Results  Component Value Date   CHOL 200  (H) 12/01/2019   TRIG 78 12/01/2019   HDL 37 (L) 12/01/2019   CHOLHDL 5.4 (H) 12/01/2019   LDLCALC 145 (H) 12/01/2019   LDLCALC 164 (H) 01/22/2018   Lab Results  Component Value Date   TSH 1.25 12/29/2015    Therapeutic Level Labs: No results found for: LITHIUM No results found for: CBMZ No results found for: VALPROATE  Current Medications: Current Outpatient Medications  Medication Sig Dispense Refill  . escitalopram (LEXAPRO) 10 MG tablet Take 1 tablet (10 mg total) by mouth daily. 30 tablet 1   No current facility-administered medications for this visit.    Musculoskeletal: Strength & Muscle Tone: UTA Gait & Station: normal, some problem due to knee pain Patient leans: N/A  Psychiatric Specialty Exam: Review of Systems  Musculoskeletal:       Left sided knee pain  Psychiatric/Behavioral: Positive for sleep disturbance. The patient is nervous/anxious.        Cleaning, hand washing  All other systems reviewed and are negative.   There were no vitals taken for this visit.There is no height or weight on file to calculate BMI.  General Appearance: Casual  Eye Contact:  Fair  Speech:  Clear and Coherent  Volume:  Normal  Mood:  Anxious  Affect:  Congruent  Thought Process:  Goal Directed and Descriptions of Associations: Intact  Orientation:  Full (Time, Place, and Person)  Thought Content:  Obsessions  Suicidal Thoughts:  No  Homicidal Thoughts:  No  Memory:  Immediate;   Fair Recent;   Fair Remote;   Fair  Judgement:  Fair  Insight:  Fair  Psychomotor Activity:  Normal  Concentration:  Concentration: Fair and Attention Span: Fair  Recall:  [employer] of Knowledge:Fair  Language: Fair  Akathisia:  No  Handed:  Right  AIMS (if indicated): UTA  Assets:  Communication Skills Desire for [place] Talents/Skills Transportation  ADL's:  Intact  Cognition: WNL  Sleep:  Poor   Screenings: GAD-7     Office Visit from  01/31/2020 in [facility] Office Visit from 04/21/2019 in [facility] Office Visit from 01/22/2018 in [facility]  Office Visit from 12/15/2017 in [facility] Office Visit from 03/23/2015 in [facility]  Total GAD-7 Score 15 7 3 1 16     PHQ2-9     Office Visit from 12/01/2019 in [facility] Office Visit from 04/21/2019 in [facility] Office Visit from 01/22/2018 in [facility] Office Visit from 12/15/2017 in [facility] Office Visit from 01/08/2016 in [facility]  PHQ-2 Total Score 4 0 0 0 0  PHQ-9 Total Score 8 2 1  0 --      Assessment and Plan: DEWARD SEBEK is a 39 year old male, married, employed, has a history of anxiety was evaluated by telemedicine today.  Patient with obsessions and compulsive behaviors, will benefit from medication readjustment and psychotherapy sessions.  Patient denies any substance abuse problems and currently denies any suicidality.  Discussed plan as noted below.  OCD-unstable Patient advised to complete Y- BOCS, will mail it to him. Increase Lexapro to 10 mg p.o. daily. Provided medication education. Will refer for CBT.  GAD-unstable Lexapro will help. Will refer for CBT  Patient will benefit from the following labs-TSH.  He agrees to get it done at his primary care office.  Follow-up in clinic in 3 to 4 weeks or sooner if needed.  I have spent atleast 45 minutes face to face by video with patient today. More than 50 % of the time was spent for preparing to see the patient ( e.g., review of test, records ),  ordering medications and test ,psychoeducation and supportive psychotherapy and care coordination,as well as documenting clinical information in electronic health record. This note was generated in part or whole with voice recognition software. Voice  recognition is usually quite accurate but there are transcription errors that can and very often do occur. I apologize for any typographical errors that were not detected and corrected.        Ursula Alert, MD 10/26/20218:22 AM

## 2020-01-31 NOTE — Patient Instructions (Signed)
Escitalopram tablets What is this medicine? ESCITALOPRAM (es sye TAL oh pram) is used to treat depression and certain types of anxiety. This medicine may be used for other purposes; ask your health care provider or pharmacist if you have questions. COMMON BRAND NAME(S): Lexapro What should I tell my health care provider before I take this medicine? They need to know if you have any of these conditions:  bipolar disorder or a family history of bipolar disorder  diabetes  glaucoma  heart disease  kidney or liver disease  receiving electroconvulsive therapy  seizures (convulsions)  suicidal thoughts, plans, or attempt by you or a family member  an unusual or allergic reaction to escitalopram, the related drug citalopram, other medicines, foods, dyes, or preservatives  pregnant or trying to become pregnant  breast-feeding How should I use this medicine? Take this medicine by mouth with a glass of water. Follow the directions on the prescription label. You can take it with or without food. If it upsets your stomach, take it with food. Take your medicine at regular intervals. Do not take it more often than directed. Do not stop taking this medicine suddenly except upon the advice of your doctor. Stopping this medicine too quickly may cause serious side effects or your condition may worsen. A special MedGuide will be given to you by the pharmacist with each prescription and refill. Be sure to read this information carefully each time. Talk to your pediatrician regarding the use of this medicine in children. Special care may be needed. Overdosage: If you think you have taken too much of this medicine contact a poison control center or emergency room at once. NOTE: This medicine is only for you. Do not share this medicine with others. What if I miss a dose? If you miss a dose, take it as soon as you can. If it is almost time for your next dose, take only that dose. Do not take double or  extra doses. What may interact with this medicine? Do not take this medicine with any of the following medications:  certain medicines for fungal infections like fluconazole, itraconazole, ketoconazole, posaconazole, voriconazole  cisapride  citalopram  dronedarone  linezolid  MAOIs like Carbex, Eldepryl, Marplan, Nardil, and Parnate  methylene blue (injected into a vein)  pimozide  thioridazine This medicine may also interact with the following medications:  alcohol  amphetamines  aspirin and aspirin-like medicines  carbamazepine  certain medicines for depression, anxiety, or psychotic disturbances  certain medicines for migraine headache like almotriptan, eletriptan, frovatriptan, naratriptan, rizatriptan, sumatriptan, zolmitriptan  certain medicines for sleep  certain medicines that treat or prevent blood clots like warfarin, enoxaparin, dalteparin  cimetidine  diuretics  dofetilide  fentanyl  furazolidone  isoniazid  lithium  metoprolol  NSAIDs, medicines for pain and inflammation, like ibuprofen or naproxen  other medicines that prolong the QT interval (cause an abnormal heart rhythm)  procarbazine  rasagiline  supplements like St. John's wort, kava kava, valerian  tramadol  tryptophan  ziprasidone This list may not describe all possible interactions. Give your health care provider a list of all the medicines, herbs, non-prescription drugs, or dietary supplements you use. Also tell them if you smoke, drink alcohol, or use illegal drugs. Some items may interact with your medicine. What should I watch for while using this medicine? Tell your doctor if your symptoms do not get better or if they get worse. Visit your doctor or health care professional for regular checks on your progress. Because it may   take several weeks to see the full effects of this medicine, it is important to continue your treatment as prescribed by your doctor. Patients  and their families should watch out for new or worsening thoughts of suicide or depression. Also watch out for sudden changes in feelings such as feeling anxious, agitated, panicky, irritable, hostile, aggressive, impulsive, severely restless, overly excited and hyperactive, or not being able to sleep. If this happens, especially at the beginning of treatment or after a change in dose, call your health care professional. You may get drowsy or dizzy. Do not drive, use machinery, or do anything that needs mental alertness until you know how this medicine affects you. Do not stand or sit up quickly, especially if you are an older patient. This reduces the risk of dizzy or fainting spells. Alcohol may interfere with the effect of this medicine. Avoid alcoholic drinks. Your mouth may get dry. Chewing sugarless gum or sucking hard candy, and drinking plenty of water may help. Contact your doctor if the problem does not go away or is severe. What side effects may I notice from receiving this medicine? Side effects that you should report to your doctor or health care professional as soon as possible:  allergic reactions like skin rash, itching or hives, swelling of the face, lips, or tongue  anxious  black, tarry stools  changes in vision  confusion  elevated mood, decreased need for sleep, racing thoughts, impulsive behavior  eye pain  fast, irregular heartbeat  feeling faint or lightheaded, falls  feeling agitated, angry, or irritable  hallucination, loss of contact with reality  loss of balance or coordination  loss of memory  painful or prolonged erections  restlessness, pacing, inability to keep still  seizures  stiff muscles  suicidal thoughts or other mood changes  trouble sleeping  unusual bleeding or bruising  unusually weak or tired  vomiting Side effects that usually do not require medical attention (report to your doctor or health care professional if they  continue or are bothersome):  changes in appetite  change in sex drive or performance  headache  increased sweating  indigestion, nausea  tremors This list may not describe all possible side effects. Call your doctor for medical advice about side effects. You may report side effects to FDA at 1-800-FDA-1088. Where should I keep my medicine? Keep out of reach of children. Store at room temperature between 15 and 30 degrees C (59 and 86 degrees F). Throw away any unused medicine after the expiration date. NOTE: This sheet is a summary. It may not cover all possible information. If you have questions about this medicine, talk to your doctor, pharmacist, or health care provider.  2020 Elsevier/Gold Standard (2018-03-16 11:21:44)  

## 2020-02-08 ENCOUNTER — Ambulatory Visit: Payer: Self-pay | Admitting: Family Medicine

## 2020-02-15 ENCOUNTER — Ambulatory Visit: Payer: 59 | Admitting: Licensed Clinical Social Worker

## 2020-02-28 ENCOUNTER — Other Ambulatory Visit: Payer: Self-pay

## 2020-02-28 ENCOUNTER — Telehealth (INDEPENDENT_AMBULATORY_CARE_PROVIDER_SITE_OTHER): Payer: Commercial Managed Care - HMO | Admitting: Psychiatry

## 2020-02-28 DIAGNOSIS — F429 Obsessive-compulsive disorder, unspecified: Secondary | ICD-10-CM

## 2020-03-07 ENCOUNTER — Ambulatory Visit (INDEPENDENT_AMBULATORY_CARE_PROVIDER_SITE_OTHER): Payer: 59 | Admitting: Licensed Clinical Social Worker

## 2020-03-07 ENCOUNTER — Encounter: Payer: Self-pay | Admitting: Licensed Clinical Social Worker

## 2020-03-07 ENCOUNTER — Other Ambulatory Visit: Payer: Self-pay

## 2020-03-07 DIAGNOSIS — F429 Obsessive-compulsive disorder, unspecified: Secondary | ICD-10-CM | POA: Diagnosis not present

## 2020-03-07 DIAGNOSIS — F411 Generalized anxiety disorder: Secondary | ICD-10-CM | POA: Diagnosis not present

## 2020-03-07 NOTE — Progress Notes (Signed)
Virtual Visit via Telephone Note  I connected with Ronald Hale on 03/07/20 at  4:00 PM EST by telephone and verified that I am speaking with the correct person using two identifiers.  Participating Parties Patient Provider  Location: Patient: Home Provider: Home Office   I discussed the limitations, risks, security and privacy concerns of performing an evaluation and management service by telephone and the availability of in person appointments. I also discussed with the patient that there may be a patient responsible charge related to this service. The patient expressed understanding and agreed to proceed.  Comprehensive Clinical Assessment (CCA) Note  03/07/20 Ronald Hale [id number]  Chief Complaint: OCD, Panic Attacks, Anxiety  Visit Diagnosis: OCD GAD  CCA Screening, Triage and Referral (STR) STR has been completed on paper by the patient/patient's guardian.  (See scanned document in Chart Review)   CCA Biopsychosocial Intake/Chief Complaint:  Pt presents as a 39 year old, married, male for assessment. Pt was referred by his psychiatrist and is seeking counseling for anxiety. Pt reported "I am real meticulous about structure, cleanliness, and tidiness". Pt reported engaging in excessive hand washing and avoids many outdoor activities due to fears of germs/getting sick from poison ivy. Pt reported this often creates conflict between patient and wife. Pt reported he first noticed these sxs really progress in his late teens and early 47s".  Current Symptoms/Problems: Anxiety, OCD, Relationship Conflict, Phobias, Panic Attacks   Patient Reported Schizophrenia/Schizoaffective Diagnosis in Past: No   Strengths: Pt reported anxiety does not negatively impact his ability to work.  Preferences: Pt reported no previous therapy.  Abilities: Pt reported he is very organized and does well at work.   Type of Services Patient Feels are Needed: Individual Therapy and  Medication Management   Initial Clinical Notes/Concerns: N/a   Mental Health Symptoms Depression:  Change in energy/activity;Increase/decrease in appetite;Irritability;Weight gain/loss   Duration of Depressive symptoms: Greater than two weeks   Mania:  None   Anxiety:   Difficulty concentrating;Fatigue;Irritability;Restlessness;Sleep;Tension;Worrying   Psychosis:  None   Duration of Psychotic symptoms: No data recorded  Trauma:  N/A   Obsessions:  Attempts to suppress/neutralize;Cause anxiety;Disrupts routine/functioning;Good insight;Intrusive/time consuming;Recurrent & persistent thoughts/impulses/images   Compulsions:  Disrupts with routine/functioning;"Driven" to perform behaviors/acts;Good insight;Intrusive/time consuming;Repeated behaviors/mental acts   Inattention:  N/A   Hyperactivity/Impulsivity:  Always on the go;Feeling of restlessness;Fidgets with hands/feet   Oppositional/Defiant Behaviors:  Argumentative   Emotional Irregularity:  N/A   Other Mood/Personality Symptoms:  Pt denied any current or past SI or hx of self-harming behavior.    Mental Status Exam Appearance and self-care  Stature:  No data recorded Assessment over the phone, unable to observe.  Weight:  No data recorded  Clothing:  No data recorded  Grooming:  No data recorded  Cosmetic use:  No data recorded  Posture/gait:  No data recorded  Motor activity:  No data recorded  Sensorium  Attention:  Normal   Concentration:  Normal   Orientation:  X5   Recall/memory:  Normal   Affect and Mood  Affect:  Appropriate   Mood:  Anxious   Relating  Eye contact:  No data recorded Assessment over the phone, unable to observe.  Facial expression:  No data recorded Assessment over the phone, unable to observe.  Attitude toward examiner:  Cooperative   Thought and Language  Speech flow: Normal   Thought content:  No data recorded  Preoccupation:  Obsessions;Ruminations;Phobias    Hallucinations:  None   Organization:  No data  recorded  [employer] of Knowledge:  Average   Intelligence:  Average   Abstraction:  Normal   Judgement:  Normal   Reality Testing:  Adequate   Insight:  Flashes of insight   Decision Making:  Normal   Social Functioning  Social Maturity:  Responsible   Social Judgement:  Normal   Stress  Stressors:  Transitions;Relationship;Work;Other (Comment) (Pandemic has exacerbated sxs)   Coping Ability:  Overwhelmed   Skill Deficits:  Self-control   Supports:  Friends/Service system;Family;Usual     Religion: Religion/Spirituality Are You A Religious Person?: Yes  Leisure/Recreation: Leisure / Recreation Do You Have Hobbies?: Yes Leisure and Hobbies: watching and keeping up with sports, but not much outside of the home  Exercise/Diet: Exercise/Diet Do You Exercise?: No Have You Gained or Lost A Significant Amount of Weight in the Past Six Months?: Yes-Lost (kidney stones) Do You Follow a Special Diet?: No Do You Have Any Trouble Sleeping?: No   CCA Employment/Education Employment/Work Situation: Employment / Work Situation Employment situation: Employed Where is patient currently employed?: [employer] Patient's job has been impacted by current illness: Yes Where was the patient employed at that time?: [employer] Has patient ever been in the [employer]?: No  Education: Education Last Grade Completed: 14 Name of [place]: [place] Did Teacher, adult education From [employer]?: Yes Did Physicist, medical?: Yes (ACC) What Type of College Degree Do you Have?: [employer] Did Heritage manager?: No Did You Have An Individualized Education Program (IIEP): No Did You Have Any Difficulty At School?: No   CCA Family/Childhood History Family and Relationship History: Family history Marital status: Married Number of Years Married: 10 What types of issues is patient dealing with in  the relationship?: Pt reported spouse is completely opposite and loves to do things outdoors. OCD puts a wedge in relationship. Are you sexually active?: Yes Does patient have children?: No  Childhood History:  Childhood History By whom was/is the patient raised?: Grandparents Description of patient's relationship with caregiver when they were a child: Pt reported "I never met my biological father. My mom was more like an older sister". Pt reported really good relationship with grandparents. Patient's description of current relationship with people who raised him/her: Grandfather passed away at age 32. Grandmother - keep in contact with. No relationship with mother. How were you disciplined when you got in trouble as a child/adolescent?: Pt reported "I really didn't cause a lot of issues and rarely in trouble. If I wasn't listening - empty threats - belt or fly swatter". Does patient have siblings?: Yes Number of Siblings: 1 Description of patient's current relationship with siblings: I have a half-brother but I am 49 years older, never really connected. Did patient suffer any verbal/emotional/physical/sexual abuse as a child?: No Did patient suffer from severe childhood neglect?: No Has patient ever been sexually abused/assaulted/raped as an adolescent or adult?: No Was the patient ever a victim of a crime or a disaster?: No Witnessed domestic violence?: No Has patient been affected by domestic violence as an adult?: No       CCA Substance Use Alcohol/Drug Use: Alcohol / Drug Use Pain Medications: SEE MAR Prescriptions: SEE MAR Over the Counter: SEE MAR History of alcohol / drug use?: No history of alcohol / drug abuse                            Recommendations for Services/Supports/Treatments: Recommendations for  Services/Supports/Treatments Recommendations For Services/Supports/Treatments: Individual Therapy, Medication Management  DSM5 Diagnoses: Patient Active  Problem List   Diagnosis Date Noted  . Nephrolithiasis 05/02/2019  . Varicocele 04/21/2017  . Venous insufficiency 03/13/2017  . Varicose veins of leg with pain, right 03/13/2017  . Insomnia 06/12/2015  . GAD (generalized anxiety disorder) 05/15/2015  . Major depression in remission ([place]) 05/15/2015  . H. pylori infection 04/12/2015  . Obsessive-compulsive disorder 03/23/2015  . Perennial allergic rhinitis with seasonal variation 08/23/2008    Patient Centered Plan: Patient is on the following Treatment Plan(s):  Anxiety  Follow Up Instructions:   I discussed the assessment and treatment plan with the patient. The patient was provided an opportunity to ask questions and all were answered. The patient agreed with the plan and demonstrated an understanding of the instructions.   The patient was advised to call back or seek an in-person evaluation if the symptoms worsen or if the condition fails to improve as anticipated.  I provided 45 minutes of non-face-to-face time during this encounter.   Jessia Kief Wynelle Link, LCSW, LCAS

## 2020-03-23 ENCOUNTER — Ambulatory Visit (INDEPENDENT_AMBULATORY_CARE_PROVIDER_SITE_OTHER): Payer: 59 | Admitting: Licensed Clinical Social Worker

## 2020-03-23 ENCOUNTER — Other Ambulatory Visit: Payer: Self-pay

## 2020-03-23 ENCOUNTER — Encounter: Payer: Self-pay | Admitting: Licensed Clinical Social Worker

## 2020-03-23 DIAGNOSIS — F411 Generalized anxiety disorder: Secondary | ICD-10-CM | POA: Diagnosis not present

## 2020-03-23 DIAGNOSIS — F429 Obsessive-compulsive disorder, unspecified: Secondary | ICD-10-CM

## 2020-03-23 NOTE — Progress Notes (Signed)
Virtual Visit via Video Note  I connected with Ronald Hale on 03/23/20 at  2:00 PM EST by a video enabled telemedicine application and verified that I am speaking with the correct person using two identifiers.  Participating Parties Patient Provider  Location: Patient: Home Provider: Home Office   I discussed the limitations of evaluation and management by telemedicine and the availability of in person appointments. The patient expressed understanding and agreed to proceed.  THERAPY PROGRESS NOTE  Session Time: 25 Minutes  Participation Level: Active  Behavioral Response: Casual and Well GroomedAlertAnxious  Type of Therapy: Individual Therapy  Treatment Goals addressed: Anxiety and Coping  Interventions: CBT  Summary: Ronald Hale is a 39 y.o. male who presents with anxiety sxs. Pt identified current stressors and attempts to cope. Pt described situations and interactions with spouse that are often triggered by patient's anxiety or spouse's reaction to patient processing his anxiety. Pt reported "If I know something is going to happen in the future that I am uncomfortable with the closer we get to that day, the more anxious and tense I become". Pt described in detail his last encounter with a feared situation in which he and spouse went to their friend's [place] in [place] last October. Pt reported he first notices the anticipation anxiety, followed by a silent 90-minute car ride. Pt reported he often copes w/ anxious thoughts by "shutting down - I stay quiet and zone out by looking out the window". Pt reported upon arrival his spouse will tell him to change his attitude and to not embarrass her, and then "avoids me". Pt reported he will process his thoughts and eventually become more relaxed and social as the day goes on. Pt engaged in grounding technique. Pt described medication he has been taking "is not having an effect" and will be visiting psychiatrist next  week.  Suicidal/Homicidal: No  Therapist Response: Therapist met with patient for follow up after completing CCA. Therapist and patient reviewed treatment plan and goals. Pt in agreement. Therapist and patient explored stressors and attempts to cope. Therapist encouraged patient to walk through a typical encounter with feared situation. Therapist provided psychoeducation around mindfulness and OCD. Therapist engaged patient in grounding exercise and assigned patient homework. Pt was very receptive.  Plan: Return again in 1 month.  Diagnosis: Axis I: Generalized Anxiety Disorder and Obsessive Compulsive Disorder    Axis II: N/A  Josephine Igo, LCSW, LCAS 03/23/2020

## 2020-03-27 ENCOUNTER — Telehealth (INDEPENDENT_AMBULATORY_CARE_PROVIDER_SITE_OTHER): Payer: 59 | Admitting: Psychiatry

## 2020-03-27 ENCOUNTER — Other Ambulatory Visit: Payer: Self-pay

## 2020-03-27 DIAGNOSIS — Z5329 Procedure and treatment not carried out because of patient's decision for other reasons: Secondary | ICD-10-CM

## 2020-03-27 NOTE — Progress Notes (Signed)
No response to call or text or video invite  

## 2020-03-30 ENCOUNTER — Other Ambulatory Visit: Payer: Self-pay | Admitting: Psychiatry

## 2020-03-30 DIAGNOSIS — F429 Obsessive-compulsive disorder, unspecified: Secondary | ICD-10-CM

## 2020-03-30 DIAGNOSIS — F411 Generalized anxiety disorder: Secondary | ICD-10-CM

## 2020-04-03 NOTE — Progress Notes (Deleted)
Name: Ronald Hale   MRN: [id number]    DOB: 18-Nov-1980   Date:04/03/2020       Progress Note  Subjective  Chief Complaint  Medication refill  HPI    Patient Active Problem List   Diagnosis Date Noted  . Nephrolithiasis 05/02/2019  . Varicocele 04/21/2017  . Venous insufficiency 03/13/2017  . Varicose veins of leg with pain, right 03/13/2017  . Insomnia 06/12/2015  . GAD (generalized anxiety disorder) 05/15/2015  . Major depression in remission ([place]) 05/15/2015  . H. pylori infection 04/12/2015  . Obsessive-compulsive disorder 03/23/2015  . Perennial allergic rhinitis with seasonal variation 08/23/2008    Past Surgical History:  Procedure Laterality Date  . ENDOVENOUS ABLATION SAPHENOUS VEIN W/ LASER Right 09/27/2017   Dr. Hurman Horn    Family History  Problem Relation Age of Onset  . Mental illness Neg Hx     Social History   Tobacco Use  . Smoking status: Never Smoker  . Smokeless tobacco: Never Used  Substance Use Topics  . Alcohol use: No    Alcohol/week: 0.0 standard drinks     Current Outpatient Medications:  .  escitalopram (LEXAPRO) 10 MG tablet, TAKE 1 TABLET BY MOUTH EVERY DAY, Disp: 30 tablet, Rfl: 0  Allergies  Allergen Reactions  . Poison Oak Extract     I personally reviewed {Reviewed:[id number]} with the patient/caregiver today.   ROS  ***  Objective  There were no vitals filed for this visit.  There is no height or weight on file to calculate BMI.  Physical Exam ***  No results found for this or any previous visit (from the past 2160 hour(s)).  Diabetic Foot Exam: Diabetic Foot Exam - Simple   No data filed    ***  PHQ2/9: Depression screen [facility] 2/9 12/01/2019 04/21/2019 01/22/2018 12/15/2017 01/08/2016  Decreased Interest 2 0 0 0 0  Down, Depressed, Hopeless 2 0 0 0 0  PHQ - 2 Score 4 0 0 0 0  Altered sleeping 1 2 0 0 -  Tired, decreased energy 0 0 1 0 -  Change in appetite 1 0 0 0 -  Feeling bad or failure  about yourself  2 0 0 0 -  Trouble concentrating 0 0 0 0 -  Moving slowly or fidgety/restless 0 0 0 0 -  Suicidal thoughts 0 0 0 0 -  PHQ-9 Score 8 2 1  0 -  Difficult doing work/chores Very difficult Not difficult at all Not difficult at all Not difficult at all -    phq 9 is {gen pos [id number]} ***  Fall Risk: Fall Risk  12/01/2019 04/21/2019 01/22/2018 12/15/2017 01/08/2016  Falls in the past year? 0 0 No No No  Number falls in past yr: 0 0 - - -  Injury with Fall? 0 0 - - -   ***   Functional Status Survey:   ***   Assessment & Plan  *** There are no diagnoses linked to this encounter.

## 2020-04-10 ENCOUNTER — Ambulatory Visit: Payer: Self-pay | Admitting: Family Medicine

## 2020-04-20 ENCOUNTER — Ambulatory Visit: Payer: 59 | Admitting: Licensed Clinical Social Worker

## 2020-04-27 ENCOUNTER — Other Ambulatory Visit: Payer: Self-pay

## 2020-04-27 ENCOUNTER — Encounter: Payer: Self-pay | Admitting: Licensed Clinical Social Worker

## 2020-04-27 ENCOUNTER — Ambulatory Visit (INDEPENDENT_AMBULATORY_CARE_PROVIDER_SITE_OTHER): Payer: Self-pay | Admitting: Licensed Clinical Social Worker

## 2020-04-27 DIAGNOSIS — F429 Obsessive-compulsive disorder, unspecified: Secondary | ICD-10-CM

## 2020-04-27 DIAGNOSIS — F411 Generalized anxiety disorder: Secondary | ICD-10-CM

## 2020-04-27 NOTE — Progress Notes (Signed)
Virtual Visit via Video Note  I connected with [name] on 04/27/20 at  2:00 PM EST by a video enabled telemedicine application and verified that I am speaking with the correct person using two identifiers.  Participating Parties Patient Provider  Location: Patient: Home Provider: Home Office   I discussed the limitations of evaluation and management by telemedicine and the availability of in person appointments. The patient expressed understanding and agreed to proceed.  THERAPY PROGRESS NOTE  Session Time: 10 Minutes  Participation Level: Active  Behavioral Response: Well GroomedAlertAnxious  Type of Therapy: Individual Therapy  Treatment Goals addressed: Anxiety and Coping  Interventions: Supportive  Summary: Ronald Hale is a 40 y.o. male who presents with minimal anxiety sxs. Pt reported "things have been fine for the most part" and has been "busy with work". Pt denied any concerns at this time and did not experience any overwhelming anxiety or panic attacks since last session. Pt reported he reviewed mindfulness skills from last session and that "it is good to know and I am sure will be helpful, but I really haven't been put in any situation to use it". Pt reported "smooth" communication with spouse who "has been under the weather". Pt reported that he is wiping things down and keeping some distance to avoid getting sick. Pt reported that at this time anxiety has been manageable without intervention. Pt reported he has not had time to reschedule appointment with psychiatrist and continues to see "no difference" in sxs with medication.   Suicidal/Homicidal: No  Therapist Response: Therapist met with patient for follow up. Therapist and patient reviewed homework assignment. Therapist asked open-ended questions to elicit thoughts, feelings and reactions around sxs and stressors. Pt was receptive.  Plan: Return again in 1 month.  Diagnosis: Axis I: Generalized  Anxiety Disorder and Obsessive Compulsive Disorder    Axis II: N/A  Josephine Igo, LCSW, LCAS 04/27/2020

## 2020-05-04 ENCOUNTER — Other Ambulatory Visit: Payer: Self-pay | Admitting: Psychiatry

## 2020-05-04 DIAGNOSIS — F411 Generalized anxiety disorder: Secondary | ICD-10-CM

## 2020-05-04 DIAGNOSIS — F429 Obsessive-compulsive disorder, unspecified: Secondary | ICD-10-CM

## 2020-05-25 ENCOUNTER — Ambulatory Visit: Payer: Commercial Managed Care - HMO | Admitting: Licensed Clinical Social Worker

## 2020-05-25 ENCOUNTER — Other Ambulatory Visit: Payer: Self-pay

## 2020-06-08 ENCOUNTER — Encounter: Payer: Self-pay | Admitting: Licensed Clinical Social Worker

## 2020-06-08 ENCOUNTER — Other Ambulatory Visit: Payer: Self-pay

## 2020-06-08 ENCOUNTER — Ambulatory Visit (INDEPENDENT_AMBULATORY_CARE_PROVIDER_SITE_OTHER): Payer: Commercial Managed Care - HMO | Admitting: Licensed Clinical Social Worker

## 2020-06-08 DIAGNOSIS — F411 Generalized anxiety disorder: Secondary | ICD-10-CM | POA: Diagnosis not present

## 2020-06-08 DIAGNOSIS — F429 Obsessive-compulsive disorder, unspecified: Secondary | ICD-10-CM | POA: Diagnosis not present

## 2020-06-08 NOTE — Progress Notes (Signed)
Virtual Visit via Video Note  I connected with Ronald Hale on 06/08/20 at  3:00 PM EST by a video enabled telemedicine application and verified that I am speaking with the correct person using two identifiers.  Participating Parties Patient Provider  Location: Patient: Home Provider: Home Office   I discussed the limitations of evaluation and management by telemedicine and the availability of in person appointments. The patient expressed understanding and agreed to proceed.  THERAPY PROGRESS NOTE  Session Time: 30 Minutes  Participation Level: Active  Behavioral Response: Well GroomedAlertAnxious  Type of Therapy: Individual Therapy  Treatment Goals addressed: Anxiety and Coping  Interventions: CBT  Summary: Ronald Hale is a 40 y.o. male who presents with mild anxiety sxs. Pt reported feeling "not too bad" lately, however is "a little anxious" about anticipated vacation to [place] for the first time to celebrate 10-year anniversary. Pt reported "I tend to do better in the winter months" in which interactions with others/outside is minimal. Pt described issues at times confronting neighbors about concerns, for example neighbor getting grass clippings all over patient's car when he mows. Pt reported "I am trying not to be uptight about it". Pt reported that he still hasn't noticed any major differences in mood/effects since starting medication several months ago. Pt reported that he anticipates anxiety level rising as the weather gets warmer, but for now it has been manageable. Pt reported no recent panic attacks.   Suicidal/Homicidal: No  Therapist Response: Therapist met with patient for follow up. Therapist and patient reviewed PHQ2-9, C-SRSS, nutrition and pain assessments. Therapist and patient reviewed anxiety triggers and intensity. Pt agreed to follow-up in 1 month after returning from vacation.  Plan: Return again in 1 month.  Diagnosis: Axis I: Generalized  Anxiety Disorder and Obsessive Compulsive Disorder    Axis II: N/A  Josephine Igo, LCSW, LCAS 06/08/2020

## 2020-07-27 ENCOUNTER — Encounter: Payer: Self-pay | Admitting: Licensed Clinical Social Worker

## 2020-07-27 ENCOUNTER — Ambulatory Visit (INDEPENDENT_AMBULATORY_CARE_PROVIDER_SITE_OTHER): Payer: Commercial Managed Care - HMO | Admitting: Licensed Clinical Social Worker

## 2020-07-27 ENCOUNTER — Other Ambulatory Visit: Payer: Self-pay

## 2020-07-27 DIAGNOSIS — F411 Generalized anxiety disorder: Secondary | ICD-10-CM

## 2020-07-27 DIAGNOSIS — F429 Obsessive-compulsive disorder, unspecified: Secondary | ICD-10-CM | POA: Diagnosis not present

## 2020-07-27 NOTE — Progress Notes (Signed)
Virtual Visit via Video Note  I connected with Ronald Hale on 07/27/20 at  3:00 PM EDT by a video enabled telemedicine application and verified that I am speaking with the correct person using two identifiers.  Participating Parties Patient Provider  Location: Patient: Home Provider: Home Office   I discussed the limitations of evaluation and management by telemedicine and the availability of in person appointments. The patient expressed understanding and agreed to proceed.  THERAPY PROGRESS NOTE  Session Time: 9 Minutes  Participation Level: Active  Behavioral Response: Well GroomedAlertAnxious and Dysphoric  Type of Therapy: Individual Therapy  Treatment Goals addressed: Anxiety and Coping  Interventions: CBT  Summary: NIGEL ERICSSON is a 40 y.o. male who presents with anxiety sxs. Pt reported feeling "so-so" since last session. Pt reported he ran out of medication and can see a difference in mood. Pt reported both he and spouse have noticed an increase in irritability. Pt reported that he continues to avoid social situations for fears of getting COVID-19 and if he does socialize he follows certain hygiene routines. Pt reported this causes disagreements with his spouse who often comments being embarrassed by patient behaviors when out in public. Pt reported "I can go to sleep, but staying asleep sometimes varies" and may wake up easily throughout the night. Pt attributed recent sleep issues to jet lag after returning from his vacation in [place].  Suicidal/Homicidal: No  Therapist Response: Therapist met with patient for follow up. Therapist and patient processed thoughts, feelings and reactions to sxs and arguments with spouse. Therapist informed patient of terminating services with this therapist due to leaving the practice on 07/28/20. Therapist and patient discussed options for continued care. Therapist encouraged patient to schedule appointment with psychiatrist and  reach out to [place] main office regarding medication concerns. Pt was receptive and reported no other concerns or questions at this time.    Plan: Return again as needed to resume therapy with new therapist/pursue outside therapy resources. Follow up with psychiatrist.  Diagnosis: Axis I: Generalized Anxiety Disorder and Obsessive Compulsive Disorder    Axis II: N/A  Josephine Igo, LCSW, LCAS 07/27/2020

## 2020-08-07 ENCOUNTER — Telehealth: Payer: Self-pay | Admitting: Psychiatry

## 2020-08-07 DIAGNOSIS — F411 Generalized anxiety disorder: Secondary | ICD-10-CM

## 2020-08-07 DIAGNOSIS — F429 Obsessive-compulsive disorder, unspecified: Secondary | ICD-10-CM

## 2020-08-08 NOTE — Telephone Encounter (Signed)
Patient needs appointment. Will not provide future refills. Will have jessica CMA contact patient.

## 2020-09-19 ENCOUNTER — Telehealth: Payer: Self-pay | Admitting: Psychiatry

## 2020-09-19 DIAGNOSIS — F429 Obsessive-compulsive disorder, unspecified: Secondary | ICD-10-CM

## 2020-09-19 DIAGNOSIS — F411 Generalized anxiety disorder: Secondary | ICD-10-CM

## 2020-09-20 MED ORDER — ESCITALOPRAM OXALATE 10 MG PO TABS: 1.0000 | ORAL_TABLET | Freq: Every day | ORAL | 1 refills | Status: DC

## 2020-09-20 NOTE — Telephone Encounter (Signed)
I have sent Lexapro 30-day supply with 1 refill to the pharmacy.  Has an appointment scheduled for July 18.  I have not seen this patient in more than 6 months.  No future refills will be provided without an appointment.

## 2020-09-22 NOTE — Telephone Encounter (Signed)
I have called and left messages twice. I will send front desk a message to also try an call pt to set up appt with christina

## 2020-10-23 ENCOUNTER — Other Ambulatory Visit: Payer: Self-pay

## 2020-10-23 ENCOUNTER — Telehealth: Payer: Commercial Managed Care - HMO | Admitting: Psychiatry

## 2020-11-16 ENCOUNTER — Other Ambulatory Visit: Payer: Self-pay | Admitting: Psychiatry

## 2020-11-16 DIAGNOSIS — F429 Obsessive-compulsive disorder, unspecified: Secondary | ICD-10-CM

## 2020-11-16 DIAGNOSIS — F411 Generalized anxiety disorder: Secondary | ICD-10-CM

## 2020-12-21 ENCOUNTER — Other Ambulatory Visit: Payer: Self-pay | Admitting: Psychiatry

## 2020-12-21 DIAGNOSIS — F411 Generalized anxiety disorder: Secondary | ICD-10-CM

## 2020-12-21 DIAGNOSIS — F429 Obsessive-compulsive disorder, unspecified: Secondary | ICD-10-CM

## 2020-12-21 NOTE — Telephone Encounter (Signed)
Patient noncompliant with follow up inspite of giving several chances to do so. Patient dismissed from care.

## 2021-01-03 ENCOUNTER — Other Ambulatory Visit: Payer: Self-pay | Admitting: Psychiatry

## 2021-01-03 DIAGNOSIS — F429 Obsessive-compulsive disorder, unspecified: Secondary | ICD-10-CM

## 2021-01-03 DIAGNOSIS — F411 Generalized anxiety disorder: Secondary | ICD-10-CM

## 2021-02-22 ENCOUNTER — Telehealth: Payer: Self-pay

## 2021-02-22 NOTE — Telephone Encounter (Signed)
spoke with patient and told him that he was no longer a patient here. that he was only seen one time and that was in 02-28-20.  and that he been dismissed because he had 1 cancel appt 02-28-20 and 2 no show appt  03-27-20 and 10-23-20.  pt states that he been getting refills and that he was going to run out.  Pt was ask where he received from because the last time dr. Shea Evans refilled was in [place] and that it was only for a 30 day supply and that would have expired in September.

## 2021-02-22 NOTE — Telephone Encounter (Signed)
Pt was already told

## 2021-02-22 NOTE — Telephone Encounter (Signed)
pt called left message that he needed refill on the lexapro

## 2021-02-22 NOTE — Telephone Encounter (Signed)
Yes please advise patient to follow up with primary care provider or establish care with a different provider, no further refills will be provided.

## 2021-02-23 ENCOUNTER — Telehealth: Payer: Self-pay | Admitting: Family Medicine

## 2021-02-23 NOTE — Telephone Encounter (Signed)
Pt called, unable to leave VM d/t mailbox not being set up. Pt needs to schedule an appt to have refills for Zoloft approved.

## 2021-02-23 NOTE — Telephone Encounter (Signed)
Requested medication (s) are due for refill today: Yes  Requested medication (s) are on the active medication list: Yes  Last refill:  11/16/20 #30/0RF  Future visit scheduled: no  Notes to clinic:  Unable to refill per protocol, appointment needed     Requested Prescriptions  Pending Prescriptions Disp Refills   escitalopram (LEXAPRO) 5 MG tablet [Pharmacy Med Name: ESCITALOPRAM 5 MG TABLET] 30 tablet     Sig: TAKE 1 TABLET BY Gem     Psychiatry:  Antidepressants - SSRI Failed - 02/23/2021  9:21 AM      Failed - Valid encounter within last 6 months    Recent Outpatient Visits           1 year ago Mild recurrent major depression [facility])   [facility] Steele Sizer, MD   1 year ago Major depression in remission [facility])   [facility] Steele Sizer, MD   3 years ago Encounter for routine history and physical exam for male   [facility] Steele Sizer, MD   3 years ago Major depression in remission [facility])   [facility] Steele Sizer, MD   5 years ago Perennial allergic rhinitis with seasonal variation   [facility] [place], Drue Stager, MD              Passed - Completed PHQ-2 or PHQ-9 in the last 360 days

## 2021-02-26 NOTE — Progress Notes (Signed)
Name: Ronald Hale   MRN: [id number]    DOB: Jan 27, 1981   Date:02/27/2021       Progress Note  Subjective  Chief Complaint  Medication Refill  HPI  Anxiety/Depression/OCD:  does not feel depressed, but still anxious and has OCD. His symptoms were severe last year, affecting his marriage and he was referred to psychiatrist. He saw Dr. Shea Evans gave him Lexapro and it has helped with anxiety and depression, OCD is also slightly better with therapy . He denies side effects. He usually cannot tell a difference but his wife notices when he is off medication ( had a gap ). He would like to get rx filled here instead of going back to see Dr. Shea Evans but he will continue to have therapy.    OCD symptoms at this time includes: cleaning ( shower/washing hands/cleaning house) , he also likes symmetry on his house and work.   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 pylori infection 04/12/2015   Obsessive-compulsive disorder 03/23/2015   Perennial allergic rhinitis with seasonal variation 08/23/2008    Past Surgical History:  Procedure Laterality Date   ENDOVENOUS ABLATION SAPHENOUS VEIN W/ LASER Right 09/27/2017   Dr. Hurman Horn    Family History  Problem Relation Age of Onset   Mental illness Neg Hx     Social History   Tobacco Use   Smoking status: Never   Smokeless tobacco: Never  Substance Use Topics   Alcohol use: No    Alcohol/week: 0.0 standard drinks     Current Outpatient Medications:    escitalopram (LEXAPRO) 10 MG  tablet, TAKE 1 TABLET BY MOUTH EVERY DAY, Disp: 30 tablet, Rfl: 0  Allergies  Allergen Reactions   Poison Oak Extract     I personally reviewed active problem list, medication list, allergies, family history, social history, health maintenance with the patient/caregiver today.   ROS  Constitutional: Negative for fever or weight change.  Respiratory: Negative for cough and shortness of breath.   Cardiovascular: Negative for chest pain or palpitations.  Gastrointestinal: Negative for abdominal pain, no bowel changes.  Musculoskeletal: Negative for gait problem or joint swelling.  Skin: Negative for rash.  Neurological: Negative for dizziness or headache.  No other specific complaints in a complete review of systems (except as listed in HPI above).   Objective  Vitals:   02/27/21 1354  BP: 128/84  Pulse: 86  Resp: 16  Temp: 97.8 F (36.6 C)  SpO2: 95%  Weight: 182 lb (82.6 kg)  Height: 6\' 2"  (1.88 m)    Body mass index is 23.37 kg/m.  Physical Exam  Constitutional: Patient appears well-developed and well-nourished.  No distress.  HEENT: head atraumatic, normocephalic, pupils equal and reactive to light, neck supple Cardiovascular: Normal rate, regular rhythm and normal heart sounds.  No murmur heard. No BLE edema. Pulmonary/Chest: Effort normal and breath sounds normal. No respiratory distress. Abdominal: Soft.  There is no tenderness. Psychiatric: Patient has a normal mood and affect. behavior is normal. Judgment and thought content normal.  PHQ2/9: Depression screen [facility] 2/9 02/27/2021 06/08/2020 12/01/2019 04/21/2019 01/22/2018  Decreased Interest 2 2 2  0 0  Down, Depressed, Hopeless 0 0 2 0 0  PHQ - 2 Score 2 2 4  0 0  Altered sleeping 0 2 1 2  0  Tired, decreased energy 2 0 0 0 1  Change in appetite 0 0 1 0 0  Feeling bad or failure about yourself  0 0 2 0 0  Trouble concentrating 0 0 0 0 0  Moving slowly or fidgety/restless 0 0 0 0 0  Suicidal thoughts 0 0 0 0  0  PHQ-9 Score 4 4 8 2 1   Difficult doing work/chores Not difficult at all Not difficult at all Very difficult Not difficult at all Not difficult at all    phq 9 is positive  GAD 7 : Generalized Anxiety Score 02/27/2021 01/31/2020 04/21/2019 01/22/2018  Nervous, Anxious, on Edge 2 2 3 1   Control/stop worrying 1 2 0 0  Worry too much - different things 2 3 1  0  Trouble relaxing 2 2 0 0  Restless 3 2 2 2   Easily annoyed or irritable 1 3 1  0  Afraid - awful might happen 0 1 0 0  Total GAD 7 Score 11 15 7 3   Anxiety Difficulty Somewhat difficult Very difficult Not difficult at all Not difficult at all     Fall Risk: Fall Risk  02/27/2021 12/01/2019 04/21/2019 01/22/2018 12/15/2017  Falls in the past year? 0 0 0 No No  Number falls in past yr: 0 0 0 - -  Injury with Fall? 0 0 0 - -  Risk for fall due to : No Fall Risks - - - -  Follow up Falls prevention discussed - - - -      Functional Status Survey: Is the patient deaf or have difficulty hearing?: No Does the patient have difficulty seeing, even when wearing glasses/contacts?: No Does the patient have difficulty concentrating, remembering, or making decisions?: No Does the patient have difficulty walking or climbing stairs?: Yes Does the patient have difficulty dressing or bathing?: No Does the patient have difficulty doing errands alone such as visiting a doctor's office or shopping?: No    Assessment & Plan  1. Obsessive-compulsive disorder, unspecified type  - escitalopram (LEXAPRO) 20 MG tablet; Take 1 tablet (20 mg total) by mouth daily.  Dispense: 90 tablet; Refill: 0  2. Major depression in remission ([place])  Resume Lexapro   3. GAD (generalized anxiety disorder)  - escitalopram (LEXAPRO) 20 MG tablet; Take 1 tablet (20 mg total) by mouth daily.  Dispense: 90 tablet; Refill: 0  4. Low serum vitamin B12  - Cyanocobalamin (B-12) 1000 MCG SUBL; Place 1 tablet under the tongue daily.  Dispense: 30 tablet; Refill:  0  5. Dyslipidemia  Discussed diet   6. History of kidney stones   7. Needs flu shot   refused

## 2021-02-26 NOTE — Telephone Encounter (Signed)
Appt scheduled with Dr Ancil Boozer for 11.22.2022

## 2021-02-27 ENCOUNTER — Other Ambulatory Visit: Payer: Self-pay

## 2021-02-27 ENCOUNTER — Encounter: Payer: Self-pay | Admitting: Family Medicine

## 2021-02-27 ENCOUNTER — Ambulatory Visit (INDEPENDENT_AMBULATORY_CARE_PROVIDER_SITE_OTHER): Payer: 59 | Admitting: Family Medicine

## 2021-02-27 VITALS — BP 128/84 | HR 86 | Temp 97.8°F | Resp 16 | Ht 74.0 in | Wt 182.0 lb

## 2021-02-27 DIAGNOSIS — E538 Deficiency of other specified B group vitamins: Secondary | ICD-10-CM | POA: Diagnosis not present

## 2021-02-27 DIAGNOSIS — F429 Obsessive-compulsive disorder, unspecified: Secondary | ICD-10-CM

## 2021-02-27 DIAGNOSIS — F325 Major depressive disorder, single episode, in full remission: Secondary | ICD-10-CM

## 2021-02-27 DIAGNOSIS — E785 Hyperlipidemia, unspecified: Secondary | ICD-10-CM

## 2021-02-27 DIAGNOSIS — F411 Generalized anxiety disorder: Secondary | ICD-10-CM

## 2021-02-27 DIAGNOSIS — Z23 Encounter for immunization: Secondary | ICD-10-CM

## 2021-02-27 DIAGNOSIS — Z87442 Personal history of urinary calculi: Secondary | ICD-10-CM

## 2021-02-27 MED ORDER — ESCITALOPRAM OXALATE 20 MG PO TABS
20.0000 mg | ORAL_TABLET | Freq: Every day | ORAL | 0 refills | Status: DC
Start: 2021-02-27 — End: 2021-05-21

## 2021-02-27 MED ORDER — B-12 1000 MCG SL SUBL: 1.0000 | SUBLINGUAL_TABLET | Freq: Every day | SUBLINGUAL | 0 refills | Status: AC

## 2021-04-03 NOTE — Progress Notes (Signed)
Did not attend. 

## 2021-05-21 ENCOUNTER — Other Ambulatory Visit: Payer: Self-pay | Admitting: Family Medicine

## 2021-05-21 DIAGNOSIS — F411 Generalized anxiety disorder: Secondary | ICD-10-CM

## 2021-05-21 DIAGNOSIS — F429 Obsessive-compulsive disorder, unspecified: Secondary | ICD-10-CM

## 2021-05-29 NOTE — Progress Notes (Unsigned)
Name: Ronald Hale   MRN: [id number]    DOB: 04-22-1980   Date:05/29/2021       Progress Note  Subjective  Chief Complaint  Follow Up  HPI  Anxiety/Depression/OCD:  does not feel depressed, but still anxious and has OCD. His symptoms were severe last year, affecting his marriage and he was referred to psychiatrist. He saw Dr. Shea Evans gave him Lexapro and it has helped with anxiety and depression, OCD is also slightly better with therapy . He denies side effects. He usually cannot tell a difference but his wife notices when he is off medication ( had a gap ). He would like to get rx filled here instead of going back to see Dr. Shea Evans but he will continue to have therapy.    OCD symptoms at this time includes: cleaning ( shower/washing hands/cleaning house) , he also likes symmetry on his house and work.   Dyslipidemia: he is not on medication, discussed healthy diet   B12 deficiency: he is not taking supplementation at this time, advised him to start SL B12 1000 mcg a day   Patient Active Problem List   Diagnosis Date Noted   Nephrolithiasis 05/02/2019   Varicocele 04/21/2017   Venous insufficiency 03/13/2017   Varicose veins of leg with pain, right 03/13/2017   Insomnia 06/12/2015   GAD (generalized anxiety disorder) 05/15/2015   Major depression in remission ([place]) 05/15/2015   H. pylori infection 04/12/2015   Obsessive-compulsive disorder 03/23/2015   Perennial allergic rhinitis with seasonal variation 08/23/2008    Past Surgical History:  Procedure Laterality Date   ENDOVENOUS ABLATION SAPHENOUS VEIN W/ LASER Right 09/27/2017   Dr. Hurman Horn    Family History  Problem Relation Age of Onset   Mental illness Neg Hx     Social History   Tobacco Use   Smoking status: Never   Smokeless tobacco: Never  Substance Use Topics   Alcohol use: No    Alcohol/week: 0.0 standard drinks     Current Outpatient Medications:    Cyanocobalamin (B-12) 1000 MCG SUBL,  Place 1 tablet under the tongue daily., Disp: 30 tablet, Rfl: 0   escitalopram (LEXAPRO) 20 MG tablet, TAKE 1 TABLET BY MOUTH EVERY DAY, Disp: 90 tablet, Rfl: 0  Allergies  Allergen Reactions   Poison Oak Extract     I personally reviewed active problem list, medication list, allergies, family history, social history, health maintenance with the patient/caregiver today.   ROS  ***  Objective  There were no vitals filed for this visit.  There is no height or weight on file to calculate BMI.  Physical Exam ***  No results found for this or any previous visit (from the past 2160 hour(s)).   PHQ2/9: Depression screen [facility] 2/9 02/27/2021 12/01/2019 04/21/2019 01/22/2018 12/15/2017  Decreased Interest 2 2 0 0 0  Down, Depressed, Hopeless 0 2 0 0 0  PHQ - 2 Score 2 4 0 0 0  Altered sleeping 0 1 2 0 0  Tired, decreased energy 2 0 0 1 0  Change in appetite 0 1 0 0 0  Feeling bad or failure about yourself  0 2 0 0 0  Trouble concentrating 0 0 0 0 0  Moving slowly or fidgety/restless 0 0 0 0 0  Suicidal thoughts 0 0 0 0 0  PHQ-9 Score 4 8 2 1  0  Difficult doing work/chores Not difficult at all Very difficult Not difficult at all Not difficult at all Not difficult at  all  Some encounter information is confidential and restricted. Go to Review Flowsheets activity to see all data.    phq 9 is {gen pos [id number]}   Fall Risk: Fall Risk  02/27/2021 12/01/2019 04/21/2019 01/22/2018 12/15/2017  Falls in the past year? 0 0 0 No No  Number falls in past yr: 0 0 0 - -  Injury with Fall? 0 0 0 - -  Risk for fall due to : No Fall Risks - - - -  Follow up Falls prevention discussed - - - -      Functional Status Survey:      Assessment & Plan  *** There are no diagnoses linked to this encounter.

## 2021-05-30 ENCOUNTER — Ambulatory Visit: Payer: 59 | Admitting: Family Medicine

## 2021-06-26 NOTE — Progress Notes (Signed)
Name: Ronald Hale   MRN: [id number]    DOB: 24-Nov-1980   Date:06/27/2021 ? ?     Progress Note ? ?Subjective ? ?Chief Complaint ? ?Follow Up ? ?HPI ? ?Anxiety/Depression/OCD:  does not feel depressed, but still anxious and has OCD. His symptoms were severe last year, affecting his marriage and he was referred to psychiatrist. He saw Dr. Shea Evans gave him Lexapro and it has helped with anxiety and depression, OCD is also slightly better with therapy . He denies side effects. He usually cannot tell a difference but his wife notices when he is off medication ( had a gap ). He has been doing well and no longer seeing therapist or psychiatrist. He denies side effects of medication  ?  ?OCD symptoms at this time includes: cleaning ( shower/washing hands/cleaning house) , he also likes symmetry on his house and work. He does not count or color coordinate. Stable and wife notices improvement with medication   ? ?Dyslipidemia: he is not on medication, discussed healthy diet , discussed returning for CPE and having labs done ? ?Skin lesion: he noticed a small bump on left antecubital area a couple of years ago but gradually getting larger and now started to bother him when he goes to the gym and lifting weight.  ? ?B12 deficiency: he has been taking supplementation now  ? ?RLS: he states over the past year he has noticed aching sensation and urge to move his legs at night, sometimes has to stand up and walk around, it can take over 30 minutes to be able to fall asleep due to leg problems. We will order labs, we can try Requip if iron storage is normal  ? ?Patient Active Problem List  ? Diagnosis Date Noted  ? Nephrolithiasis 05/02/2019  ? Varicocele 04/21/2017  ? Venous insufficiency 03/13/2017  ? Varicose veins of leg with pain, right 03/13/2017  ? Insomnia 06/12/2015  ? GAD (generalized anxiety disorder) 05/15/2015  ? Major depression in remission ([place]) 05/15/2015  ? H. pylori infection 04/12/2015  ? Obsessive-compulsive  disorder 03/23/2015  ? Perennial allergic rhinitis with seasonal variation 08/23/2008  ? ? ?Past Surgical History:  ?Procedure Laterality Date  ? ENDOVENOUS ABLATION SAPHENOUS VEIN W/ LASER Right 09/27/2017  ? Dr. Hurman Horn  ? ? ?Family History  ?Problem Relation Age of Onset  ? Mental illness Neg Hx   ? ? ?Social History  ? ?Tobacco Use  ? Smoking status: Never  ? Smokeless tobacco: Never  ?Substance Use Topics  ? Alcohol use: No  ?  Alcohol/week: 0.0 standard drinks  ? ? ? ?Current Outpatient Medications:  ?  Cyanocobalamin (B-12) 1000 MCG SUBL, Place 1 tablet under the tongue daily., Disp: 30 tablet, Rfl: 0 ?  escitalopram (LEXAPRO) 20 MG tablet, TAKE 1 TABLET BY MOUTH EVERY DAY, Disp: 90 tablet, Rfl: 0 ? ?Allergies  ?Allergen Reactions  ? Poison Oak Extract   ? ? ?I personally reviewed active problem list, medication list, allergies, family history, social history, health maintenance with the patient/caregiver today. ? ? ?ROS ? ?Constitutional: Negative for fever or weight change.  ?Respiratory: Negative for cough and shortness of breath.   ?Cardiovascular: Negative for chest pain or palpitations.  ?Gastrointestinal: Negative for abdominal pain, no bowel changes.  ?Musculoskeletal: Negative for gait problem or joint swelling.  ?Skin: Negative for rash.  ?Neurological: Negative for dizziness or headache.  ?No other specific complaints in a complete review of systems (except as listed in HPI above).  ? ?  Objective ? ?Vitals:  ? 06/27/21 1055  ?BP: 122/78  ?Pulse: 81  ?Resp: 16  ?SpO2: 99%  ?Weight: 188 lb (85.3 kg)  ?Height: '6\' 2"'$  (1.88 m)  ? ? ?Body mass index is 24.14 kg/m?. ? ?Physical Exam ? ?Constitutional: Patient appears well-developed and well-nourished.  No distress.  ?HEENT: head atraumatic, normocephalic, pupils equal and reactive to light,  neck supple ?Cardiovascular: Normal rate, regular rhythm and normal heart sounds.  No murmur heard. No BLE edema. ?Pulmonary/Chest: Effort normal and breath  sounds normal. No respiratory distress. ?Abdominal: Soft.  There is no tenderness. ?Skin: subcutaneous lesion on antecubital area, rolls under skin , no change in skin tone.  ?Psychiatric: Patient has a normal mood and affect. behavior is normal. Judgment and thought content normal.  ? ?PHQ2/9: ? ?  06/27/2021  ? 10:58 AM 02/27/2021  ?  1:53 PM 06/08/2020  ?  3:18 PM 12/01/2019  ? 11:45 AM 04/21/2019  ? 10:50 AM  ?Depression screen PHQ 2/9  ?Decreased Interest 0 2  2 0  ?Down, Depressed, Hopeless 0 0  2 0  ?PHQ - 2 Score 0 2  4 0  ?Altered sleeping 0 0  1 2  ?Tired, decreased energy 0 2  0 0  ?Change in appetite 0 0  1 0  ?Feeling bad or failure about yourself  0 0  2 0  ?Trouble concentrating 0 0  0 0  ?Moving slowly or fidgety/restless 0 0  0 0  ?Suicidal thoughts 0 0  0 0  ?PHQ-9 Score 0 '4  8 2  '$ ?Difficult doing work/chores  Not difficult at all  Very difficult Not difficult at all  ?  ? Information is confidential and restricted. Go to Review Flowsheets to unlock data.  ?  ?phq 9 is negative ? ? ?Fall Risk: ? ?  06/27/2021  ? 10:54 AM 02/27/2021  ?  1:53 PM 12/01/2019  ? 11:45 AM 04/21/2019  ? 10:40 AM 01/22/2018  ?  8:38 AM  ?Fall Risk   ?Falls in the past year? 0 0 0 0 No  ?Number falls in past yr: 0 0 0 0   ?Injury with Fall? 0 0 0 0   ?Risk for fall due to : No Fall Risks No Fall Risks     ?Follow up Falls prevention discussed Falls prevention discussed     ? ? ? ? ?Functional Status Survey: ?Is the patient deaf or have difficulty hearing?: No ?Does the patient have difficulty seeing, even when wearing glasses/contacts?: No ?Does the patient have difficulty concentrating, remembering, or making decisions?: No ?Does the patient have difficulty walking or climbing stairs?: No ?Does the patient have difficulty dressing or bathing?: No ?Does the patient have difficulty doing errands alone such as visiting a doctor's office or shopping?: No ? ? ? ?Assessment & Plan ? ?1. Obsessive-compulsive disorder, unspecified  type ? ?- escitalopram (LEXAPRO) 20 MG tablet; Take 1 tablet (20 mg total) by mouth daily.  Dispense: 90 tablet; Refill: 1 ? ?2. GAD (generalized anxiety disorder) ? ?- escitalopram (LEXAPRO) 20 MG tablet; Take 1 tablet (20 mg total) by mouth daily.  Dispense: 90 tablet; Refill: 1 ? ?3. Major depression in remission [facility]) ? ?- TSH ? ?4. Lesion of subcutaneous tissue ? ?- Ambulatory referral to General Surgery ? ?5. RLS (restless legs syndrome) ? ?- Ferritin ? ?6. Dyslipidemia ? ?- Lipid panel ? ?7. Low serum vitamin B12 ? ?- Vitamin B12 ? ?8. Long-term use of high-risk  medication ? ?- COMPLETE METABOLIC PANEL WITH GFR ?- CBC with Differential/Platelet ? ?9. Diabetes mellitus screening ? ?- Hemoglobin A1c  ?

## 2021-06-27 ENCOUNTER — Encounter: Payer: Self-pay | Admitting: Family Medicine

## 2021-06-27 ENCOUNTER — Ambulatory Visit (INDEPENDENT_AMBULATORY_CARE_PROVIDER_SITE_OTHER): Payer: 59 | Admitting: Family Medicine

## 2021-06-27 VITALS — BP 122/78 | HR 81 | Resp 16 | Ht 74.0 in | Wt 188.0 lb

## 2021-06-27 DIAGNOSIS — L989 Disorder of the skin and subcutaneous tissue, unspecified: Secondary | ICD-10-CM | POA: Diagnosis not present

## 2021-06-27 DIAGNOSIS — F429 Obsessive-compulsive disorder, unspecified: Secondary | ICD-10-CM

## 2021-06-27 DIAGNOSIS — Z79899 Other long term (current) drug therapy: Secondary | ICD-10-CM

## 2021-06-27 DIAGNOSIS — G2581 Restless legs syndrome: Secondary | ICD-10-CM

## 2021-06-27 DIAGNOSIS — E538 Deficiency of other specified B group vitamins: Secondary | ICD-10-CM

## 2021-06-27 DIAGNOSIS — F411 Generalized anxiety disorder: Secondary | ICD-10-CM | POA: Diagnosis not present

## 2021-06-27 DIAGNOSIS — Z131 Encounter for screening for diabetes mellitus: Secondary | ICD-10-CM

## 2021-06-27 DIAGNOSIS — E785 Hyperlipidemia, unspecified: Secondary | ICD-10-CM

## 2021-06-27 DIAGNOSIS — F325 Major depressive disorder, single episode, in full remission: Secondary | ICD-10-CM | POA: Diagnosis not present

## 2021-06-27 MED ORDER — ESCITALOPRAM OXALATE 20 MG PO TABS: 20.0000 mg | ORAL_TABLET | Freq: Every day | ORAL | 1 refills | Status: DC

## 2021-06-28 LAB — LIPID PANEL
Cholesterol: 220 mg/dL — ABNORMAL HIGH (ref <200)
HDL: 43 mg/dL (ref >=40)
LDL Cholesterol (Calc): 146 mg/dL (calc) — ABNORMAL HIGH
Non-HDL Cholesterol (Calc): 177 mg/dL (calc) — ABNORMAL HIGH (ref <130)
Total CHOL/HDL Ratio: 5.1 (calc) — ABNORMAL HIGH (ref <5.0)
Triglycerides: 170 mg/dL — ABNORMAL HIGH (ref <150)

## 2021-06-28 LAB — COMPLETE METABOLIC PANEL WITH GFR
AG Ratio: 1.8 (calc) (ref 1.0–2.5)
ALT: 15 U/L (ref 9–46)
AST: 20 U/L (ref 10–40)
Albumin: 4.6 g/dL (ref 3.6–5.1)
Alkaline phosphatase (APISO): 61 U/L (ref 36–130)
BUN: 14 mg/dL (ref 7–25)
CO2: 31 mmol/L (ref 20–32)
Calcium: 9.3 mg/dL (ref 8.6–10.3)
Chloride: 102 mmol/L (ref 98–110)
Creat: 0.93 mg/dL (ref 0.60–1.26)
Globulin: 2.5 g/dL (calc) (ref 1.9–3.7)
Glucose, Bld: 73 mg/dL (ref 65–99)
Potassium: 4.1 mmol/L (ref 3.5–5.3)
Sodium: 140 mmol/L (ref 135–146)
Total Bilirubin: 0.9 mg/dL (ref 0.2–1.2)
Total Protein: 7.1 g/dL (ref 6.1–8.1)
eGFR: 107 mL/min/{1.73_m2} (ref >=60)

## 2021-06-28 LAB — CBC WITH DIFFERENTIAL/PLATELET
Absolute Monocytes: 352 cells/uL (ref 200–950)
Basophils Absolute: 31 cells/uL (ref 0–200)
Basophils Relative: 0.6 %
Eosinophils Absolute: 51 cells/uL (ref 15–500)
Eosinophils Relative: 1 %
HCT: 45.8 % (ref 38.5–50.0)
Hemoglobin: 15.5 g/dL (ref 13.2–17.1)
Lymphs Abs: 1668 cells/uL (ref 850–3900)
MCH: 30.6 pg (ref 27.0–33.0)
MCHC: 33.8 g/dL (ref 32.0–36.0)
MCV: 90.5 fL (ref 80.0–100.0)
MPV: 9.9 fL (ref 7.5–12.5)
Monocytes Relative: 6.9 %
Neutro Abs: 2999 cells/uL (ref 1500–7800)
Neutrophils Relative %: 58.8 %
Platelets: 221 10*3/uL (ref 140–400)
RBC: 5.06 10*6/uL (ref 4.20–5.80)
RDW: 11.9 % (ref 11.0–15.0)
Total Lymphocyte: 32.7 %
WBC: 5.1 10*3/uL (ref 3.8–10.8)

## 2021-06-28 LAB — TSH: TSH: 0.97 mIU/L (ref 0.40–4.50)

## 2021-06-28 LAB — HEMOGLOBIN A1C
Hgb A1c MFr Bld: 4.9 % of total Hgb (ref <5.7)
Mean Plasma Glucose: 94 mg/dL
eAG (mmol/L): 5.2 mmol/L

## 2021-06-28 LAB — FERRITIN: Ferritin: 165 ng/mL (ref 38–380)

## 2021-06-28 LAB — VITAMIN B12: Vitamin B-12: 236 pg/mL (ref 200–1100)

## 2021-07-05 ENCOUNTER — Ambulatory Visit (INDEPENDENT_AMBULATORY_CARE_PROVIDER_SITE_OTHER): Payer: 59 | Admitting: Surgery

## 2021-07-05 ENCOUNTER — Encounter: Payer: Self-pay | Admitting: Surgery

## 2021-07-05 VITALS — BP 118/80 | HR 84 | Temp 98.6°F | Ht 74.0 in | Wt 192.0 lb

## 2021-07-05 DIAGNOSIS — D1801 Hemangioma of skin and subcutaneous tissue: Secondary | ICD-10-CM | POA: Diagnosis not present

## 2021-07-05 NOTE — Patient Instructions (Addendum)
We will get you scheduled for an ultrasound of your left arm. ? ?We will have you follow up here for a procedure to remove this in office.  ? ? ? ?You are scheduled for an ultrasound exam at [facility] on 07/09/21. You will need to arrive there by 8:30 am.  ? ? ?

## 2021-07-06 NOTE — Progress Notes (Signed)
Patient ID: Ronald Hale, male   DOB: 04-06-1981, 41 y.o.   MRN: [id number] ? ?Chief Complaint:  Mass left antecubital fossa. ? ?History of Present Illness ?Ronald Hale is a 41 y.o. male with the above noted to increase in size.  Present for months, now beginning to get more painful with biceps workout, arm curls.  Smaller one noted in the right antecubital area.  Minimally tender to touch worse when weight lifting.  Beginning to affect his workouts no history of discharge, skin changes, known trauma, fevers or chills. ? ?Past Medical History ?Past Medical History:  ?Diagnosis Date  ? Allergy   ? Anxiety   ? GERD (gastroesophageal reflux disease)   ? Left arm pain   ? Wheezing   ?  ? ? ?Past Surgical History:  ?Procedure Laterality Date  ? ENDOVENOUS ABLATION SAPHENOUS VEIN W/ LASER Right 09/27/2017  ? Dr. Hurman Horn  ? NASAL SEPTUM SURGERY    ? ? ?Allergies  ?Allergen Reactions  ? Poison Oak Extract   ? ? ?Current Outpatient Medications  ?Medication Sig Dispense Refill  ? Cyanocobalamin (B-12) 1000 MCG SUBL Place 1 tablet under the tongue daily. 30 tablet 0  ? diphenhydrAMINE (BENADRYL) 25 mg capsule Take 25 mg by mouth at bedtime.    ? escitalopram (LEXAPRO) 20 MG tablet Take 1 tablet (20 mg total) by mouth daily. 90 tablet 1  ? ?No current facility-administered medications for this visit.  ? ? ?Family History ?Family History  ?Problem Relation Age of Onset  ? Mental illness Neg Hx   ?  ? ? ?Social History ?Social History  ? ?Tobacco Use  ? Smoking status: Never  ?  Passive exposure: Never  ? Smokeless tobacco: Never  ?Vaping Use  ? Vaping Use: Never used  ?Substance Use Topics  ? Alcohol use: No  ?  Alcohol/week: 0.0 standard drinks  ? Drug use: No  ?  ?  ? ? ?Review of Systems  ?Constitutional: Negative.   ?HENT: Negative.    ?Eyes: Negative.   ?Respiratory: Negative.    ?Cardiovascular: Negative.   ?Gastrointestinal: Negative.   ?Genitourinary: Negative.   ?Skin: Negative.   ?Neurological:  Negative.   ?Psychiatric/Behavioral: Negative.    ?  ? ?Physical Exam ?Blood pressure 118/80, pulse 84, temperature 98.6 ?F (37 ?C), height '6\' 2"'$  (1.88 m), weight 192 lb (87.1 kg), SpO2 98 %. ?Last Weight  Most recent update: 07/05/2021  3:49 PM  ? ? Weight  ?87.1 kg (192 lb)  ?      ? ?  ? ? ?CONSTITUTIONAL: Well developed, and nourished, appropriately responsive and aware without distress.   ?EYES: Sclera non-icteric.   ?EARS, NOSE, MOUTH AND THROAT:  The oropharynx is clear. Oral mucosa is pink and moist.   Hearing is intact to voice.  ?NECK: Trachea is midline, and there is no jugular venous distension.  ?LYMPH NODES:  Lymph nodes in the neck are not enlarged. ?RESPIRATORY:  Lungs are clear, and breath sounds are equal bilaterally. Normal respiratory effort without pathologic use of accessory muscles. ?CARDIOVASCULAR: Heart is regular in rate and rhythm. ?GI: The abdomen is  soft, nontender, and nondistended.  ?MUSCULOSKELETAL:  Symmetrical muscle tone appreciated in all four extremities.  ?There is a 1.5 to 2 cm slightly raised lesion in the lateral left antecubital fossa, suspicious for hemangioma.  No overlying skin changes.  No fluctuance, no blanching with palpation. ?There is a smaller lesion subcentimeter on the right antecubital fossa. ?SKIN: Skin  turgor is normal. No pathologic skin lesions appreciated.  ?NEUROLOGIC:  Motor and sensation appear grossly normal.  Cranial nerves are grossly without defect. ?PSYCH:  Alert and oriented to person, place and time. Affect is appropriate for situation. ? ?Data Reviewed ?I have personally reviewed what is currently available of the patient's imaging, recent labs and medical records.   ?Labs:  ? ?  Latest Ref Rng & Units 06/27/2021  ? 11:34 AM 12/01/2019  ? 12:42 PM 09/29/2019  ? 10:40 AM  ?CBC  ?WBC 3.8 - 10.8 Thousand/uL 5.1   5.6   8.1    ?Hemoglobin 13.2 - 17.1 g/dL 15.5   15.2   15.6    ?Hematocrit 38.5 - 50.0 % 45.8   44.9   44.5    ?Platelets 140 - 400  Thousand/uL 221   233   185    ? ? ?  Latest Ref Rng & Units 06/27/2021  ? 11:34 AM 12/01/2019  ? 12:42 PM 09/29/2019  ? 10:40 AM  ?CMP  ?Glucose 65 - 99 mg/dL 73   79   145    ?BUN 7 - 25 mg/dL '14   13   17    '$ ?Creatinine 0.60 - 1.26 mg/dL 0.93   0.84   1.01    ?Sodium 135 - 146 mmol/L 140   141   141    ?Potassium 3.5 - 5.3 mmol/L 4.1   4.3   4.1    ?Chloride 98 - 110 mmol/L 102   104   105    ?CO2 20 - 32 mmol/L '31   27   27    '$ ?Calcium 8.6 - 10.3 mg/dL 9.3   8.9   8.8    ?Total Protein 6.1 - 8.1 g/dL 7.1   6.9     ?Total Bilirubin 0.2 - 1.2 mg/dL 0.9   0.8     ?AST 10 - 40 U/L 20   15     ?ALT 9 - 46 U/L 15   11     ? ? ? ? ?Imaging: ? ?Within last 24 hrs: No results found. ? ?Assessment ?   ?Suspect possible hemangiomatous type lesion, or potential venous malformation/scarring from weightlifting, as these have appeared bilaterally.  Because they are symptomatic we will likely pursue excision. ?Patient Active Problem List  ? Diagnosis Date Noted  ? Nephrolithiasis 05/02/2019  ? Varicocele 04/21/2017  ? Venous insufficiency 03/13/2017  ? Varicose veins of leg with pain, right 03/13/2017  ? Insomnia 06/12/2015  ? GAD (generalized anxiety disorder) 05/15/2015  ? Major depression in remission ([place]) 05/15/2015  ? H. pylori infection 04/12/2015  ? Obsessive-compulsive disorder 03/23/2015  ? Perennial allergic rhinitis with seasonal variation 08/23/2008  ? ? ?Plan ?   ?We will begin with evaluation by ultrasound to assess/prepare for degree of vascularity.  We will have him follow-up shortly thereafter, hopefully coordinating an in office procedure under local anesthesia. ? ?Face-to-face time spent with the patient and accompanying care providers(if present) was 20 minutes, with more than 50% of the time spent counseling, educating, and coordinating care of the patient.   ? ?These notes generated with voice recognition software. I apologize for typographical errors. ? ?Ronald Hale M.D., FACS ?07/06/2021, 1:07  PM ? ? ? ? ?

## 2021-07-09 ENCOUNTER — Ambulatory Visit
Admission: RE | Admit: 2021-07-09 | Discharge: 2021-07-09 | Disposition: A | Discharge status: Home / Self Care | Payer: 59 | Source: Ambulatory Visit | Attending: Surgery | Admitting: Surgery

## 2021-07-09 DIAGNOSIS — D1801 Hemangioma of skin and subcutaneous tissue: Secondary | ICD-10-CM | POA: Diagnosis not present

## 2021-07-17 ENCOUNTER — Ambulatory Visit: Payer: 59 | Admitting: Surgery

## 2021-07-17 ENCOUNTER — Encounter: Payer: Self-pay | Admitting: Surgery

## 2021-07-17 ENCOUNTER — Other Ambulatory Visit: Payer: Self-pay | Admitting: Surgery

## 2021-07-17 VITALS — BP 119/78 | HR 85 | Temp 98.3°F | Ht 74.0 in | Wt 197.0 lb

## 2021-07-17 DIAGNOSIS — D1801 Hemangioma of skin and subcutaneous tissue: Secondary | ICD-10-CM | POA: Diagnosis not present

## 2021-07-17 NOTE — Progress Notes (Signed)
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 ? ?Pre-operative Diagnosis: Left antecubital fossa hemangioma. ? ?Post-operative Diagnosis: same.   ? ?Surgeon: [name], M.D., FACS ? ?Anesthesia: Local, 1% lidocaine with epi. ? ?Findings: As expected hemangioma, sent for permanent section. ? ?Estimated Blood Loss: <5 mL ?        ?Specimens: Hemangiomatous lesion left antecubital fossa.    ?      ?Complications: none ?        ?     ?Procedure Details  ?The patient was seen again prior to procedure. The benefits, complications, treatment options, and expected outcomes were discussed with the patient. The risks of bleeding, infection, recurrence of symptoms, failure to resolve symptoms, unanticipated injury, prosthetic placement, prosthetic infection, any of which could require further surgery were reviewed with the patient. The likelihood of improving the patient's symptoms with return to their baseline status is expected.  The patient and/or family concurred with the proposed plan, giving informed consent.   ?The patient was positioned in the supine position and the left mid arm was prepped with  Chloraprep and draped in the sterile fashion.  ?A Time Out was held and the above information confirmed. ? ?After local infiltration of anesthetic, an incision was made overlying the mass long axis of the arm.  The hemangiomatous mass was immediately under the dermis, and sharply dissected away from the dermis, hemostats applied for hemostasis.  We then mobilized the mass from the adjacent subcutaneous tissues inferiorly and circumferentially with blunt dissection.  We then found a vein feeding the lesion hemostat was applied.  And the mass excised.  Ligatures of 3-0 Vicryl were utilized for hemostasis.  Minimal electrocautery was used.  The wound was then closed with dermal sutures of 3-0 Vicryl after assurance of hemostasis.  The skin was then sealed with Dermabond. ?Tolerated procedure  well ? ? ? ? ? ?[name] M.D., FACS ?[facility] ?07/17/2021 2:01 PM   ?

## 2021-07-17 NOTE — Patient Instructions (Addendum)
We have removed a Hemangioma in our office today. ? ?You have sutures under the skin that will dissolve and also dermabond (skin glue) on top of your skin which will come off on it's own in 10-14 days. ? ?You may shower in 24 hours, do not submerge the area.  ? ?You may use Tylenol or Ibuprofen as needed for any pain. Use the ice pack to the area for 10 minutes at a time for several times today and tomorrow.  ?You can take Ibuprofen 600 mg(3 otc pills) every 6 hours as needed.  ? ?Avoid Strenuous activities that will make you sweat during the next 48 hours to avoid the glue coming off prematurely. Avoid activities that will place pressure to this area of the body for 1-2 weeks to avoid re-injury to incision site. ? ?Please see your follow-up appointment provided. We will see you back in office to make sure this area is healed and to review the final pathology. If you have any questions or concerns prior to this appointment, call our office and speak with a nurse. ? ? ? ?Excision of Skin Cysts or Lesions ?Excision of a skin lesion refers to the removal of a section of skin by making small cuts (incisions) in the skin. This procedure may be done to remove a cancerous (malignant) or noncancerous (benign) growth on the skin. It is typically done to treat or prevent cancer or infection. It may also be done to improve cosmetic appearance. ?The procedure may be done to remove: ?Cancerous growths, such as basal cell carcinoma, squamous cell carcinoma, or melanoma. ?Noncancerous growths, such as a cyst or lipoma. ?Growths, such as moles or skin tags, which may be removed for cosmetic reasons. ? ?Various excision or surgical techniques may be used depending on your condition, the location of the lesion, and your overall health. ?Tell a health care provider about: ?Any allergies you have. ?All medicines you are taking, including vitamins, herbs, eye drops, creams, and over-the-counter medicines. ?Any problems you or family  members have had with anesthetic medicines. ?Any blood disorders you have. ?Any surgeries you have had. ?Any medical conditions you have. ?Whether you are pregnant or may be pregnant. ?What are the risks? ?Generally, this is a safe procedure. However, problems may occur, including: ?Bleeding. ?Infection. ?Scarring. ?Recurrence of the cyst, lipoma, or cancer. ?Changes in skin sensation or appearance, such as discoloration or swelling. ?Reaction to the anesthetics. ?Allergic reaction to surgical materials or ointments. ?Damage to nerves, blood vessels, muscles, or other structures. ?Continued pain. ? ?What happens before the procedure? ?Ask your health care provider about: ?Changing or stopping your regular medicines. This is especially important if you are taking diabetes medicines or blood thinners. ?Taking medicines such as aspirin and ibuprofen. These medicines can thin your blood. Do not take these medicines before your procedure if your health care provider instructs you not to. ?You may be asked to take certain medicines. ?You may be asked to stop smoking. ?You may have an exam or testing. ?Plan to have someone take you home after the procedure. ?Plan to have someone help you with activities during recovery. ?What happens during the procedure? ?To reduce your risk of infection: ?Your health care team will wash or sanitize their hands. ?Your skin will be washed with soap. ?You will be given a medicine to numb the area (local anesthetic). ?One of the following excision techniques will be performed. ?At the end of any of these procedures, antibiotic ointment will be applied as  needed. ?Each of the following techniques may vary among health care providers and hospitals. ?Complete Surgical Excision ?The area of skin that needs to be removed will be marked with a pen. Using a small scalpel or scissors, the surgeon will gently cut around and under the lesion until it is completely removed. The lesion will be placed in  a fluid and sent to the lab for examination. If necessary, bleeding will be controlled with a device that delivers heat (electrocautery). The edges of the wound may be stitched (sutured) together, and a bandage (dressing) will be applied. This procedure may be performed to treat a cancerous growth or a noncancerous cyst or lesion. ?Excision of a Cyst ?The surgeon will make an incision on the cyst. The entire cyst will be removed through the incision. The incision may be closed with sutures. ? ?

## 2021-07-26 ENCOUNTER — Other Ambulatory Visit: Payer: Self-pay

## 2021-07-26 ENCOUNTER — Ambulatory Visit (INDEPENDENT_AMBULATORY_CARE_PROVIDER_SITE_OTHER): Payer: 59 | Admitting: Surgery

## 2021-07-26 ENCOUNTER — Encounter: Payer: Self-pay | Admitting: Surgery

## 2021-07-26 ENCOUNTER — Telehealth: Payer: Self-pay | Admitting: *Deleted

## 2021-07-26 VITALS — BP 127/87 | HR 82 | Temp 98.3°F | Ht 72.0 in | Wt 194.4 lb

## 2021-07-26 DIAGNOSIS — Z09 Encounter for follow-up examination after completed treatment for conditions other than malignant neoplasm: Secondary | ICD-10-CM

## 2021-07-26 DIAGNOSIS — D1801 Hemangioma of skin and subcutaneous tissue: Secondary | ICD-10-CM

## 2021-07-26 NOTE — Telephone Encounter (Signed)
error 

## 2021-07-26 NOTE — Patient Instructions (Signed)
Please call our office with any questions or concerns. 

## 2021-07-26 NOTE — Progress Notes (Signed)
[facility] ?POST-OP OFFICE VISIT ? ?07/26/2021 ? ?HPI: ?Ronald Hale is a 41 y.o. male 9 days s/p excision of left antecubital fossa hemangioma. ?Reports having some serosanguineous drainage quite early in the process, lifting off the Dermabond.  No significant postprocedural pain, he appears anxious to get back to lifting again. ?Vital signs: ?BP 127/87   Pulse 82   Temp 98.3 ?F (36.8 ?C) (Oral)   Ht 6' (1.829 m)   Wt 194 lb 6.4 oz (88.2 kg)   SpO2 97%   BMI 26.37 kg/m?   ? ?Physical Exam: ?Constitutional: He appears well ? ?Skin: Healing scar left lateral antecubital fossa.  Some reaction to dermal Vicryl, likely making the scar a bit more prominent.  No evidence of drainage, erythema or induration. ? ?Pathology: ?Soft tissue, biopsy, Left arm anticubital hemangioma ?- HEMANGIOMA WITH ORGANIZING THROMBI. ?- NO MALIGNANCY IDENTIFIED. ? ? ?Assessment/Plan: ?This is a 41 y.o. male 9 days s/p excision of left antecubital fossa hemangioma.  Confirmed benign. ? ?Patient Active Problem List  ? Diagnosis Date Noted  ? Nephrolithiasis 05/02/2019  ? Varicocele 04/21/2017  ? Venous insufficiency 03/13/2017  ? Varicose veins of leg with pain, right 03/13/2017  ? Insomnia 06/12/2015  ? GAD (generalized anxiety disorder) 05/15/2015  ? Major depression in remission ([place]) 05/15/2015  ? H. pylori infection 04/12/2015  ? Obsessive-compulsive disorder 03/23/2015  ? Perennial allergic rhinitis with seasonal variation 08/23/2008  ? ? -We are readily available and glad to assist this gentleman for any future needs that may arise. ? ? ?[name] M.D., FACS ?07/26/2021, 2:07 PM  ?

## 2021-08-15 NOTE — Progress Notes (Signed)
Disability forms have been faxed to [facility] at [phone]. ?

## 2021-08-16 ENCOUNTER — Telehealth: Payer: Self-pay | Admitting: Family Medicine

## 2021-08-16 NOTE — Telephone Encounter (Signed)
Pt is calling to see if his iron storage is normal. Pt is still interested in the [employer] ? ?[phone] ? ?

## 2021-08-17 NOTE — Telephone Encounter (Signed)
Pt notified, he would rath do sbl b12 does not like shots ?

## 2021-09-19 ENCOUNTER — Telehealth: Payer: Self-pay | Admitting: Family Medicine

## 2021-09-19 NOTE — Telephone Encounter (Signed)
Pt called back to advise that the pharmacy has allowed him to use his current pharmacy.  No issues pt will pick up medication this afternoon.   I verified with the pharmacy.

## 2021-09-19 NOTE — Telephone Encounter (Signed)
[facility] will not fill the pt's  escitalopram (LEXAPRO) 20 MG tablet Due to his insurance. The pharmacist states evidentially his insurance has contracted with another pharmacy or mail order for maintenance meds.  Pt will call back w/ the pharmacy his insurance wants him to use after he contacts them

## 2022-01-30 ENCOUNTER — Ambulatory Visit: Payer: 59 | Admitting: Family Medicine

## 2022-03-05 NOTE — Progress Notes (Unsigned)
Name: Ronald Hale   MRN: [id number]    DOB: 1980-05-20   Date:03/06/2022       Progress Note  Subjective  Chief Complaint  Follow Up  HPI  Anxiety/Depression/OCD:  does not feel depressed, but still anxious and has OCD. His symptoms were severe in 2022 , affecting his marriage and he was referred to psychiatrist. He saw Dr. Shea Evans gave him Lexapro and it has helped with anxiety and depression, OCD is also slightly better with therapy . He denies side effects. He usually cannot tell a difference but his wife notices when he is off medication ( had a gap ), he states he feels that sometimes he feels agitated and asked about other options. Discussed Vraylar and Abilify , we will start with generic Abilify first and switch if necessary.. He has been doing well and no longer seeing therapist or psychiatrist.   OCD symptoms at this time includes: cleaning ( shower/washing hands/cleaning house) , he also likes symmetry on his house and work. He does not count or color coordinate. Stable and wife notices improvement with medication  Discussed going back to therapist   Dyslipidemia: he is not on medication, discussed healthy diet  The 10-year ASCVD risk score (Arnett DK, et al., 2019) is: 1.5%   Values used to calculate the score:     Age: 41 years     Sex: Male     Is Non-Hispanic African American: No     Diabetic: No     Tobacco smoker: No     Systolic Blood Pressure: 627 mmHg     Is BP treated: No     HDL Cholesterol: 43 mg/dL     Total Cholesterol: 220 mg/dL    RLS: he states over the past year he has noticed aching sensation and urge to move his legs at night, sometimes has to stand up and walk around, it can take over 30 minutes to be able to fall asleep due to leg problems. Labs showed low B12, advised to continue supplementation  Patient Active Problem List   Diagnosis Date Noted   Nephrolithiasis 05/02/2019   Varicocele 04/21/2017   Venous insufficiency 03/13/2017   Varicose  veins of leg with pain, right 03/13/2017   Insomnia 06/12/2015   GAD (generalized anxiety disorder) 05/15/2015   Major depression in remission ([place]) 05/15/2015   H. pylori infection 04/12/2015   Obsessive-compulsive disorder 03/23/2015   Perennial allergic rhinitis with seasonal variation 08/23/2008    Past Surgical History:  Procedure Laterality Date   ENDOVENOUS ABLATION SAPHENOUS VEIN W/ LASER Right 09/27/2017   Dr. Hurman Horn   NASAL SEPTUM SURGERY      Family History  Problem Relation Age of Onset   Mental illness Neg Hx     Social History   Tobacco Use   Smoking status: Never    Passive exposure: Never   Smokeless tobacco: Never  Substance Use Topics   Alcohol use: No    Alcohol/week: 0.0 standard drinks of alcohol     Current Outpatient Medications:    Cyanocobalamin (B-12) 1000 MCG SUBL, Place 1 tablet under the tongue daily., Disp: 30 tablet, Rfl: 0   diphenhydrAMINE (BENADRYL) 25 mg capsule, Take 25 mg by mouth at bedtime., Disp: , Rfl:    escitalopram (LEXAPRO) 20 MG tablet, Take 1 tablet (20 mg total) by mouth daily., Disp: 90 tablet, Rfl: 1  Allergies  Allergen Reactions   Poison Oak Extract     I personally reviewed active  problem list, medication list, allergies, family history, social history, health maintenance with the patient/caregiver today.   ROS  Constitutional: Negative for fever or weight change.  Respiratory: Negative for cough and shortness of breath.   Cardiovascular: Negative for chest pain or palpitations.  Gastrointestinal: Negative for abdominal pain, no bowel changes.  Musculoskeletal: Negative for gait problem or joint swelling.  Skin: Negative for rash.  Neurological: Negative for dizziness or headache.  No other specific complaints in a complete review of systems (except as listed in HPI above).   Objective  Vitals:   03/06/22 0932  BP: 122/80  Pulse: 76  Resp: 14  Temp: 98 F (36.7 C)  TempSrc: Oral  SpO2:  96%  Weight: 203 lb (92.1 kg)  Height: '6\' 2"'$  (1.88 m)    Body mass index is 26.06 kg/m.  Physical Exam  Constitutional: Patient appears well-developed and well-nourished.  No distress.  HEENT: head atraumatic, normocephalic, pupils equal and reactive to light, neck supple Cardiovascular: Normal rate, regular rhythm and normal heart sounds.  No murmur heard. No BLE edema. Pulmonary/Chest: Effort normal and breath sounds normal. No respiratory distress. Abdominal: Soft.  There is no tenderness. Psychiatric: Patient has a normal mood and affect. behavior is normal. Judgment and thought content normal.    PHQ2/9:    03/06/2022    9:40 AM 06/27/2021   10:58 AM 02/27/2021    1:53 PM 06/08/2020    3:18 PM 12/01/2019   11:45 AM  Depression screen PHQ 2/9  Decreased Interest 0 0 2  2  Down, Depressed, Hopeless 0 0 0  2  PHQ - 2 Score 0 0 2  4  Altered sleeping 0 0 0  1  Tired, decreased energy 0 0 2  0  Change in appetite 0 0 0  1  Feeling bad or failure about yourself  0 0 0  2  Trouble concentrating 0 0 0  0  Moving slowly or fidgety/restless 0 0 0  0  Suicidal thoughts 0 0 0  0  PHQ-9 Score 0 0 4  8  Difficult doing work/chores   Not difficult at all  Very difficult     Information is confidential and restricted. Go to Review Flowsheets to unlock data.    phq 9 is negative     03/06/2022    9:41 AM 02/27/2021    2:10 PM 01/31/2020   11:26 AM 04/21/2019   10:50 AM  GAD 7 : Generalized Anxiety Score  Nervous, Anxious, on Edge '2 2  3  '$ Control/stop worrying 0 1  0  Worry too much - different things 0 2  1  Trouble relaxing 1 2  0  Restless '3 3  2  '$ Easily annoyed or irritable '3 1  1  '$ Afraid - awful might happen 0 0  0  Total GAD 7 Score '9 11  7  '$ Anxiety Difficulty Not difficult at all Somewhat difficult  Not difficult at all     Information is confidential and restricted. Go to Review Flowsheets to unlock data.       Fall Risk:    03/06/2022    9:32 AM 07/05/2021     3:45 PM 06/27/2021   10:54 AM 02/27/2021    1:53 PM 12/01/2019   11:45 AM  Fall Risk   Falls in the past year? 1 0 0 0 0  Number falls in past yr: 0 0 0 0 0  Injury with Fall? 0 0 0 0 0  Risk for fall due to : No Fall Risks;History of fall(s)  No Fall Risks No Fall Risks   Follow up Falls prevention discussed;Education provided;Falls evaluation completed  Falls prevention discussed Falls prevention discussed       Functional Status Survey: Is the patient deaf or have difficulty hearing?: No Does the patient have difficulty seeing, even when wearing glasses/contacts?: No Does the patient have difficulty concentrating, remembering, or making decisions?: No Does the patient have difficulty walking or climbing stairs?: Yes Does the patient have difficulty dressing or bathing?: No Does the patient have difficulty doing errands alone such as visiting a doctor's office or shopping?: No    Assessment & Plan  1. GAD (generalized anxiety disorder)  - escitalopram (LEXAPRO) 20 MG tablet; Take 1 tablet (20 mg total) by mouth daily.  Dispense: 90 tablet; Refill: 1  2. Major depression in remission ([place])  - escitalopram (LEXAPRO) 20 MG tablet; Take 1 tablet (20 mg total) by mouth daily.  Dispense: 90 tablet; Refill: 1 - ARIPiprazole (ABILIFY) 2 MG tablet; Take 1 tablet (2 mg total) by mouth daily.  Dispense: 90 tablet; Refill: 0  3. Obsessive-compulsive disorder, unspecified type  - escitalopram (LEXAPRO) 20 MG tablet; Take 1 tablet (20 mg total) by mouth daily.  Dispense: 90 tablet; Refill: 1 - ARIPiprazole (ABILIFY) 2 MG tablet; Take 1 tablet (2 mg total) by mouth daily.  Dispense: 90 tablet; Refill: 0  4. RLS (restless legs syndrome)   5. Low serum vitamin B12   6. Dyslipidemia

## 2022-03-06 ENCOUNTER — Ambulatory Visit: Payer: 59 | Admitting: Family Medicine

## 2022-03-06 ENCOUNTER — Encounter: Payer: Self-pay | Admitting: Family Medicine

## 2022-03-06 VITALS — BP 122/80 | HR 76 | Temp 98.0°F | Resp 14 | Ht 74.0 in | Wt 203.0 lb

## 2022-03-06 DIAGNOSIS — G2581 Restless legs syndrome: Secondary | ICD-10-CM | POA: Diagnosis not present

## 2022-03-06 DIAGNOSIS — F411 Generalized anxiety disorder: Secondary | ICD-10-CM

## 2022-03-06 DIAGNOSIS — F325 Major depressive disorder, single episode, in full remission: Secondary | ICD-10-CM | POA: Diagnosis not present

## 2022-03-06 DIAGNOSIS — F429 Obsessive-compulsive disorder, unspecified: Secondary | ICD-10-CM | POA: Diagnosis not present

## 2022-03-06 DIAGNOSIS — E785 Hyperlipidemia, unspecified: Secondary | ICD-10-CM

## 2022-03-06 DIAGNOSIS — E538 Deficiency of other specified B group vitamins: Secondary | ICD-10-CM

## 2022-03-06 MED ORDER — ARIPIPRAZOLE 2 MG PO TABS: 2.0000 mg | ORAL_TABLET | Freq: Every day | ORAL | 0 refills | Status: DC

## 2022-03-06 MED ORDER — ESCITALOPRAM OXALATE 20 MG PO TABS: 20.0000 mg | ORAL_TABLET | Freq: Every day | ORAL | 1 refills | Status: DC

## 2022-06-12 ENCOUNTER — Encounter: Payer: 59 | Admitting: Family Medicine

## 2022-06-12 ENCOUNTER — Ambulatory Visit: Payer: 59 | Admitting: Family Medicine

## 2022-07-01 NOTE — Progress Notes (Deleted)
Name: Ronald Hale   MRN: [id number]    DOB: 19-Aug-1980   Date:07/01/2022       Progress Note  Subjective  Chief Complaint  Follow Up  HPI  Anxiety/Depression/OCD:  does not feel depressed, but still anxious and has OCD. His symptoms were severe in 2022 , affecting his marriage and he was referred to psychiatrist. He saw Dr. Shea Evans gave him Lexapro and it has helped with anxiety and depression, OCD is also slightly better with therapy . He denies side effects. He usually cannot tell a difference but his wife notices when he is off medication ( had a gap ), he states he feels that sometimes he feels agitated and asked about other options. Discussed Vraylar and Abilify , we will start with generic Abilify first and switch if necessary.. He has been doing well and no longer seeing therapist or psychiatrist.   OCD symptoms at this time includes: cleaning ( shower/washing hands/cleaning house) , he also likes symmetry on his house and work. He does not count or color coordinate. Stable and wife notices improvement with medication  Discussed going back to therapist   Dyslipidemia: he is not on medication, discussed healthy diet  The 10-year ASCVD risk score (Arnett DK, et al., 2019) is: 1.5%   Values used to calculate the score:     Age: 42 years     Sex: Male     Is Non-Hispanic African American: No     Diabetic: No     Tobacco smoker: No     Systolic Blood Pressure: 123XX123 mmHg     Is BP treated: No     HDL Cholesterol: 43 mg/dL     Total Cholesterol: 220 mg/dL    RLS: he states over the past year he has noticed aching sensation and urge to move his legs at night, sometimes has to stand up and walk around, it can take over 30 minutes to be able to fall asleep due to leg problems. 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 pylori infection 04/12/2015   Obsessive-compulsive disorder 03/23/2015   Perennial allergic rhinitis with seasonal variation 08/23/2008    Past Surgical History:  Procedure Laterality Date   ENDOVENOUS ABLATION SAPHENOUS VEIN W/ LASER Right 09/27/2017   Dr. Hurman Horn   NASAL SEPTUM SURGERY      Family History  Problem Relation Age of Onset   Mental illness Neg Hx     Social History   Tobacco Use   Smoking status: Never    Passive exposure: Never   Smokeless tobacco: Never  Substance Use Topics   Alcohol use: No    Alcohol/week: 0.0 standard drinks of alcohol     Current Outpatient Medications:    ARIPiprazole (ABILIFY) 2 MG tablet, Take 1 tablet (2 mg total) by mouth daily., Disp: 90 tablet, Rfl: 0   Cyanocobalamin (B-12) 1000 MCG SUBL, Place 1 tablet under the tongue daily., Disp: 30 tablet, Rfl: 0   diphenhydrAMINE (BENADRYL) 25 mg capsule, Take 25 mg by mouth at bedtime., Disp: , Rfl:    escitalopram (LEXAPRO) 20 MG tablet, Take 1 tablet (20 mg total) by mouth daily., Disp: 90  tablet, Rfl: 1  Allergies  Allergen Reactions   Poison Oak Extract     I personally reviewed active problem list, medication list, allergies, family history, social history, health maintenance with the patient/caregiver today.   ROS  ***  Objective  There were no vitals filed for this visit.  There is no height or weight on file to calculate BMI.  Physical Exam ***  No results found for this or any previous visit (from the past 2160 hour(s)).   PHQ2/9:    03/06/2022    9:40 AM 06/27/2021   10:58 AM 02/27/2021    1:53 PM 06/08/2020    3:18 PM 12/01/2019   11:45 AM  Depression screen PHQ 2/9  Decreased Interest 0 0 2  2  Down, Depressed, Hopeless 0 0 0  2  PHQ - 2 Score 0 0 2  4  Altered sleeping 0 0 0   1  Tired, decreased energy 0 0 2  0  Change in appetite 0 0 0  1  Feeling bad or failure about yourself  0 0 0  2  Trouble concentrating 0 0 0  0  Moving slowly or fidgety/restless 0 0 0  0  Suicidal thoughts 0 0 0  0  PHQ-9 Score 0 0 4  8  Difficult doing work/chores   Not difficult at all  Very difficult     Information is confidential and restricted. Go to Review Flowsheets to unlock data.    phq 9 is {gen pos [id number]   Fall Risk:    03/06/2022    9:32 AM 07/05/2021    3:45 PM 06/27/2021   10:54 AM 02/27/2021    1:53 PM 12/01/2019   11:45 AM  Fall Risk   Falls in the past year? 1 0 0 0 0  Number falls in past yr: 0 0 0 0 0  Injury with Fall? 0 0 0 0 0  Risk for fall due to : No Fall Risks;History of fall(s)  No Fall Risks No Fall Risks   Follow up Falls prevention discussed;Education provided;Falls evaluation completed  Falls prevention discussed Falls prevention discussed       Functional Status Survey:      Assessment & Plan  *** There are no diagnoses linked to this encounter.

## 2022-07-02 ENCOUNTER — Ambulatory Visit: Payer: 59 | Admitting: Family Medicine

## 2022-08-06 NOTE — Progress Notes (Deleted)
Name: Ronald Hale   MRN: [id number]    DOB: [birth year]   Date:08/06/2022       Progress Note  Subjective  Chief Complaint  Follow Up  HPI  Anxiety/Depression/OCD:  does not feel depressed, but still anxious and has OCD. His symptoms were severe in 2022 , affecting his marriage and he was referred to psychiatrist. He saw Dr. Elna Breslow gave him Lexapro and it has helped with anxiety and depression, OCD is also slightly better with therapy . He denies side effects. He usually cannot tell a difference but his wife notices when he is off medication ( had a gap ), he states he feels that sometimes he feels agitated and asked about other options. Discussed Vraylar and Abilify , we will start with generic Abilify first and switch if necessary.. He has been doing well and no longer seeing therapist or psychiatrist.   OCD symptoms at this time includes: cleaning ( shower/washing hands/cleaning house) , he also likes symmetry on his house and work. He does not count or color coordinate. Stable and wife notices improvement with medication  Discussed going back to therapist   Dyslipidemia: he is not on medication, discussed healthy diet  The 10-year ASCVD risk score (Arnett DK, et al., 2019) is: 1.5%   Values used to calculate the score:     Age: 43 years     Sex: Male     Is Non-Hispanic African American: No     Diabetic: No     Tobacco smoker: No     Systolic Blood Pressure: 122 mmHg     Is BP treated: No     HDL Cholesterol: 43 mg/dL     Total Cholesterol: 220 mg/dL    RLS: he states over the past year he has noticed aching sensation and urge to move his legs at night, sometimes has to stand up and walk around, it can take over 30 minutes to be able to fall asleep due to leg problems. 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 pylori infection 04/12/2015   Obsessive-compulsive disorder 03/23/2015   Perennial allergic rhinitis with seasonal variation 08/23/2008    Past Surgical History:  Procedure Laterality Date   ENDOVENOUS ABLATION SAPHENOUS VEIN W/ LASER Right 09/27/2017   Dr. Thora Lance   NASAL SEPTUM SURGERY      Family History  Problem Relation Age of Onset   Mental illness Neg Hx     Social History   Tobacco Use   Smoking status: Never    Passive exposure: Never   Smokeless tobacco: Never  Substance Use Topics   Alcohol use: No    Alcohol/week: 0.0 standard drinks of alcohol     Current Outpatient Medications:    ARIPiprazole (ABILIFY) 2 MG tablet, Take 1 tablet (2 mg total) by mouth daily., Disp: 90 tablet, Rfl: 0   Cyanocobalamin (B-12) 1000 MCG SUBL, Place 1 tablet under the tongue daily., Disp: 30 tablet, Rfl: 0   diphenhydrAMINE (BENADRYL) 25 mg capsule, Take 25 mg by mouth at bedtime., Disp: , Rfl:    escitalopram (LEXAPRO) 20 MG tablet, Take 1 tablet (20 mg total) by mouth daily., Disp: 90  tablet, Rfl: 1  Allergies  Allergen Reactions   Poison Oak Extract     I personally reviewed active problem list, medication list, allergies, family history, social history, health maintenance with the patient/caregiver today.   ROS  ***  Objective  There were no vitals filed for this visit.  There is no height or weight on file to calculate BMI.  Physical Exam ***  No results found for this or any previous visit (from the past 2160 hour(s)).   PHQ2/9:    03/06/2022    9:40 AM 06/27/2021   10:58 AM 02/27/2021    1:53 PM 06/08/2020    3:18 PM 12/01/2019   11:45 AM  Depression screen PHQ 2/9  Decreased Interest 0 0 2  2  Down, Depressed, Hopeless 0 0 0  2  PHQ - 2 Score 0 0 2  4  Altered sleeping 0 0 0   1  Tired, decreased energy 0 0 2  0  Change in appetite 0 0 0  1  Feeling bad or failure about yourself  0 0 0  2  Trouble concentrating 0 0 0  0  Moving slowly or fidgety/restless 0 0 0  0  Suicidal thoughts 0 0 0  0  PHQ-9 Score 0 0 4  8  Difficult doing work/chores   Not difficult at all  Very difficult     Information is confidential and restricted. Go to Review Flowsheets to unlock data.    phq 9 is {gen pos [id number]   Fall Risk:    03/06/2022    9:32 AM 07/05/2021    3:45 PM 06/27/2021   10:54 AM 02/27/2021    1:53 PM 12/01/2019   11:45 AM  Fall Risk   Falls in the past year? 1 0 0 0 0  Number falls in past yr: 0 0 0 0 0  Injury with Fall? 0 0 0 0 0  Risk for fall due to : No Fall Risks;History of fall(s)  No Fall Risks No Fall Risks   Follow up Falls prevention discussed;Education provided;Falls evaluation completed  Falls prevention discussed Falls prevention discussed       Functional Status Survey:      Assessment & Plan  *** There are no diagnoses linked to this encounter.

## 2022-08-07 ENCOUNTER — Ambulatory Visit: Payer: 59 | Admitting: Family Medicine

## 2022-10-16 ENCOUNTER — Other Ambulatory Visit: Payer: Self-pay | Admitting: Family Medicine

## 2022-10-16 DIAGNOSIS — F411 Generalized anxiety disorder: Secondary | ICD-10-CM

## 2022-10-16 DIAGNOSIS — F429 Obsessive-compulsive disorder, unspecified: Secondary | ICD-10-CM

## 2022-10-16 DIAGNOSIS — F325 Major depressive disorder, single episode, in full remission: Secondary | ICD-10-CM

## 2022-10-28 ENCOUNTER — Other Ambulatory Visit: Payer: Self-pay | Admitting: Family Medicine

## 2022-10-28 DIAGNOSIS — F411 Generalized anxiety disorder: Secondary | ICD-10-CM

## 2022-10-28 DIAGNOSIS — F325 Major depressive disorder, single episode, in full remission: Secondary | ICD-10-CM

## 2022-10-28 DIAGNOSIS — F429 Obsessive-compulsive disorder, unspecified: Secondary | ICD-10-CM

## 2022-10-28 NOTE — Telephone Encounter (Signed)
Medication Refill - Medication: escitalopram (LEXAPRO) 20 MG tablet   Pt scheduled an appt for 10/29/2022. Pt is wanting to see if his medication could be called in.   Has the patient contacted their pharmacy? Yes.   Pharmacy advised pt will need appt for refill.     Preferred Pharmacy (with phone number or street name): [id number] [place], [place] - [address] Phone: [phone]  Fax: [phone]   Has the patient been seen for an appointment in the last year OR does the patient have an upcoming appointment? Yes.    Agent: Please be advised that RX refills may take up to 3 business days. We ask that you follow-up with your pharmacy.

## 2022-10-28 NOTE — Progress Notes (Unsigned)
Name: Ronald Hale   MRN: [id number]    DOB: [birth year]   Date:10/29/2022       Progress Note  Subjective  Chief Complaint  Medication Refill  HPI  Anxiety/MDD/OCD:  does not feel depressed, but still anxious and has OCD. His symptoms were severe in 2022 , affecting his marriage and he was referred to psychiatrist. He saw Dr. Elna Breslow gave him Lexapro and it has helped with anxiety and depression, OCD is also slightly better with therapy . He denies side effects. He usually cannot tell a difference but his wife notices when he is off medication . He saw me last Fall and told me he was having episodes of agitation , we gave him Abilify but he did not noticed an improvement of symptoms. He states he is trying to be more mindful.  He has been doing well and no longer seeing therapist or psychiatrist.   OCD symptoms at this time includes: cleaning ( shower/washing hands/cleaning house) , he also likes symmetry on his house and work. He does not count or color coordinate. Stable and wife notices improvement with medication  Stable and wants to continue Lexapro   Dyslipidemia: he is not on medication,  risk of heart attack is low   The 10-year ASCVD risk score (Arnett DK, et al., 2019) is: 1.3%   Values used to calculate the score:     Age: 58 years     Sex: Male     Is Non-Hispanic African American: No     Diabetic: No     Tobacco smoker: No     Systolic Blood Pressure: 112 mmHg     Is BP treated: No     HDL Cholesterol: 43 mg/dL     Total Cholesterol: 220 mg/dL    RLS: he states over the past year he has noticed aching sensation and urge to move his legs at night, sometimes has to stand up and walk around, it can take over 30 minutes to be able to fall asleep due to leg problems. 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 pylori infection 04/12/2015   Obsessive-compulsive disorder 03/23/2015   Perennial allergic rhinitis with seasonal variation 08/23/2008    Past Surgical History:  Procedure Laterality Date   ENDOVENOUS ABLATION SAPHENOUS VEIN W/ LASER Right 09/27/2017   Dr. Thora Lance   NASAL SEPTUM SURGERY      Family History  Problem Relation Age of Onset   Mental illness Neg Hx     Social History   Tobacco Use   Smoking status: Never    Passive exposure: Never   Smokeless tobacco: Never  Substance Use Topics   Alcohol use: No    Alcohol/week: 0.0 standard drinks of alcohol     Current Outpatient Medications:    Cyanocobalamin (B-12) 1000 MCG SUBL, Place 1 tablet under the tongue daily., Disp: 30 tablet, Rfl: 0   escitalopram (LEXAPRO)  20 MG tablet, Take 1 tablet (20 mg total) by mouth daily., Disp: 90 tablet, Rfl: 1   ARIPiprazole (ABILIFY) 2 MG tablet, Take 1 tablet (2 mg total) by mouth daily. (Patient not taking: Reported on 10/29/2022), Disp: 90 tablet, Rfl: 0   diphenhydrAMINE (BENADRYL) 25 mg capsule, Take 25 mg by mouth at bedtime. (Patient not taking: Reported on 10/29/2022), Disp: , Rfl:   Allergies  Allergen Reactions   Poison Oak Extract     I personally reviewed active problem list, medication list, allergies, family history, social history, health maintenance with the patient/caregiver today.   ROS  Ten systems reviewed and is negative except as mentioned in HPI    Objective  Vitals:   10/29/22 0950  BP: 112/78  Pulse: 82  Resp: 16  Temp: 97.7 F (36.5 C)  TempSrc:  Oral  SpO2: 95%  Weight: 211 lb 11.2 oz (96 kg)  Height: 6\' 2"  (1.88 m)    Body mass index is 27.18 kg/m.  Physical Exam  Constitutional: Patient appears well-developed and well-nourished. No distress.  HEENT: head atraumatic, normocephalic, pupils equal and reactive to light, neck supple Cardiovascular: Normal rate, regular rhythm and normal heart sounds.  No murmur heard. No BLE edema. Pulmonary/Chest: Effort normal and breath sounds normal. No respiratory distress. Abdominal: Soft.  There is no tenderness. Psychiatric: Patient has a normal mood and affect. behavior is normal. Judgment and thought content normal.    PHQ2/9:    10/29/2022    9:45 AM 03/06/2022    9:40 AM 06/27/2021   10:58 AM 02/27/2021    1:53 PM 06/08/2020    3:18 PM  Depression screen PHQ 2/9  Decreased Interest 0 0 0 2   Down, Depressed, Hopeless 0 0 0 0   PHQ - 2 Score 0 0 0 2   Altered sleeping 0 0 0 0   Tired, decreased energy 0 0 0 2   Change in appetite 0 0 0 0   Feeling bad or failure about yourself  0 0 0 0   Trouble concentrating 0 0 0 0   Moving slowly or fidgety/restless 0 0 0 0   Suicidal thoughts 0 0 0 0   PHQ-9 Score 0 0 0 4   Difficult doing work/chores Not difficult at all   Not difficult at all      Information is confidential and restricted. Go to Review Flowsheets to unlock data.    phq 9 is negative   Fall Risk:    10/29/2022    9:45 AM 03/06/2022    9:32 AM 07/05/2021    3:45 PM 06/27/2021   10:54 AM 02/27/2021    1:53 PM  Fall Risk   Falls in the past year? 0 1 0 0 0  Number falls in past yr: 0 0 0 0 0  Injury with Fall? 0 0 0 0 0  Risk for fall due to : No Fall Risks No Fall Risks;History of fall(s)  No Fall Risks No Fall Risks  Follow up Falls prevention discussed;Education provided;Falls evaluation completed Falls prevention discussed;Education provided;Falls evaluation completed  Falls prevention discussed Falls prevention discussed      Functional Status  Survey: Is the patient deaf or have difficulty hearing?: No Does the patient have difficulty seeing, even when wearing glasses/contacts?: No Does the patient have difficulty concentrating, remembering, or making decisions?: No Does the patient have difficulty walking or climbing stairs?: No Does the patient have difficulty dressing or bathing?: No Does the  patient have difficulty doing errands alone such as visiting a doctor's office or shopping?: No    Assessment & Plan  1. GAD (generalized anxiety disorder)  - escitalopram (LEXAPRO) 20 MG tablet; Take 1 tablet (20 mg total) by mouth daily.  Dispense: 90 tablet; Refill: 1  2. Major depression in remission (HCC)  - escitalopram (LEXAPRO) 20 MG tablet; Take 1 tablet (20 mg total) by mouth daily.  Dispense: 90 tablet; Refill: 1  3. Low serum vitamin B12  - B12 and Folate Panel  4. RLS (restless legs syndrome)  Taking B12 and still has symptoms at night   5. Dyslipidemia  - Lipid panel  6. Diabetes mellitus screening  - Hemoglobin A1c  7. Long-term use of high-risk medication  - CBC with Differential/Platelet - COMPLETE METABOLIC PANEL WITH GFR  8. Obsessive-compulsive disorder, unspecified type  - escitalopram (LEXAPRO) 20 MG tablet; Take 1 tablet (20 mg total) by mouth daily.  Dispense: 90 tablet; Refill: 1

## 2022-10-29 ENCOUNTER — Ambulatory Visit: Payer: 59 | Admitting: Family Medicine

## 2022-10-29 ENCOUNTER — Encounter: Payer: Self-pay | Admitting: Family Medicine

## 2022-10-29 VITALS — BP 112/78 | HR 82 | Temp 97.7°F | Resp 16 | Ht 74.0 in | Wt 211.7 lb

## 2022-10-29 DIAGNOSIS — Z131 Encounter for screening for diabetes mellitus: Secondary | ICD-10-CM

## 2022-10-29 DIAGNOSIS — G2581 Restless legs syndrome: Secondary | ICD-10-CM

## 2022-10-29 DIAGNOSIS — F325 Major depressive disorder, single episode, in full remission: Secondary | ICD-10-CM

## 2022-10-29 DIAGNOSIS — E538 Deficiency of other specified B group vitamins: Secondary | ICD-10-CM | POA: Diagnosis not present

## 2022-10-29 DIAGNOSIS — F411 Generalized anxiety disorder: Secondary | ICD-10-CM

## 2022-10-29 DIAGNOSIS — F429 Obsessive-compulsive disorder, unspecified: Secondary | ICD-10-CM

## 2022-10-29 DIAGNOSIS — E785 Hyperlipidemia, unspecified: Secondary | ICD-10-CM

## 2022-10-29 DIAGNOSIS — Z79899 Other long term (current) drug therapy: Secondary | ICD-10-CM

## 2022-10-29 MED ORDER — ROPINIROLE HCL 0.5 MG PO TABS
0.5000 mg | ORAL_TABLET | Freq: Three times a day (TID) | ORAL | 0 refills | Status: DC
Start: 2022-10-29 — End: 2023-01-21

## 2022-10-29 MED ORDER — ESCITALOPRAM OXALATE 20 MG PO TABS: 20.0000 mg | ORAL_TABLET | Freq: Every day | ORAL | 1 refills | Status: DC

## 2022-11-04 IMAGING — US US EXTREM UP*L* LTD
1 series · 14 of 21 positions shown · non-contrast
Comparison: None.

CLINICAL DATA: cavernous hemangioma left anticubital fossa

EXAM:
ULTRASOUND LEFT UPPER EXTREMITY LIMITED
TECHNIQUE: Ultrasound examination of the upper extremity soft tissues was
performed in the area of clinical concern.

[Series 1: us extrem up*left* ltd · 0.07mm/px · 21 acquisitions, 14 frames shown]
[im 1/21]
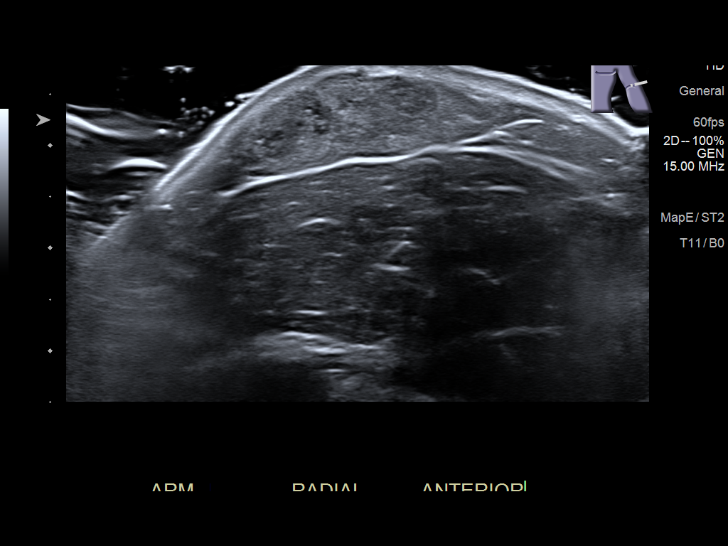
[im 3/21]
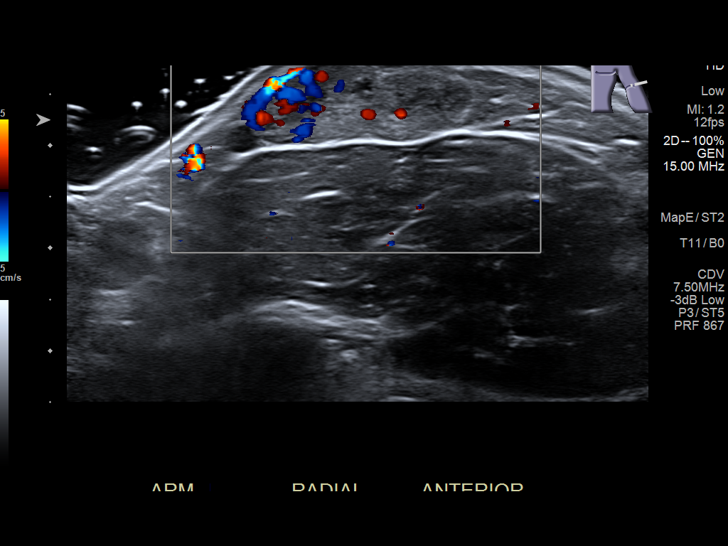
[im 4/21]
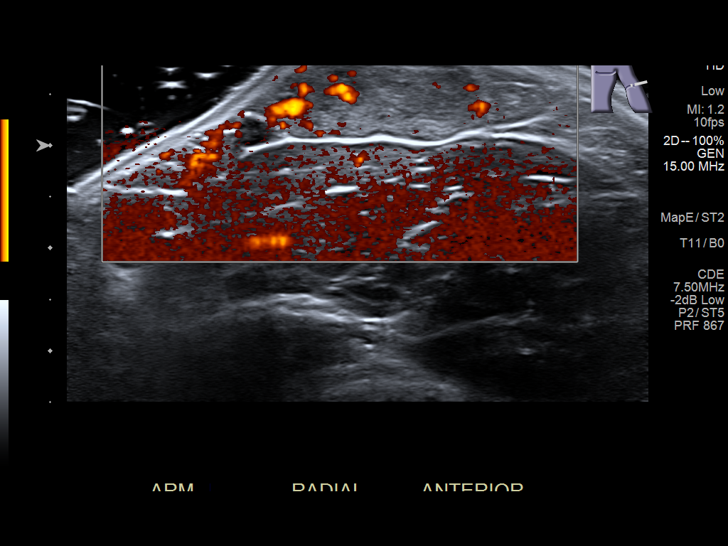
[im 6/21]
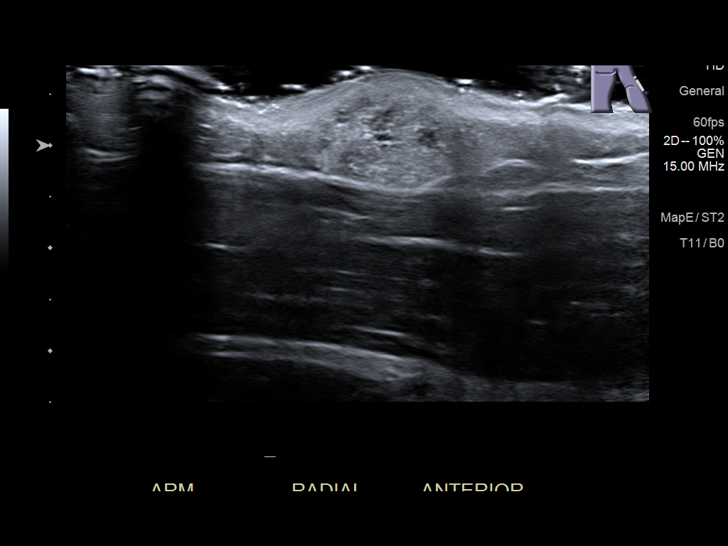
[im 7/21]
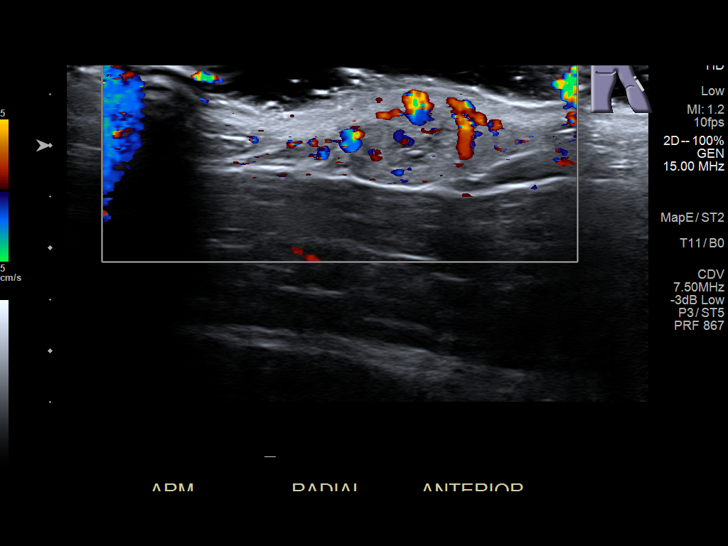
[im 9/21]
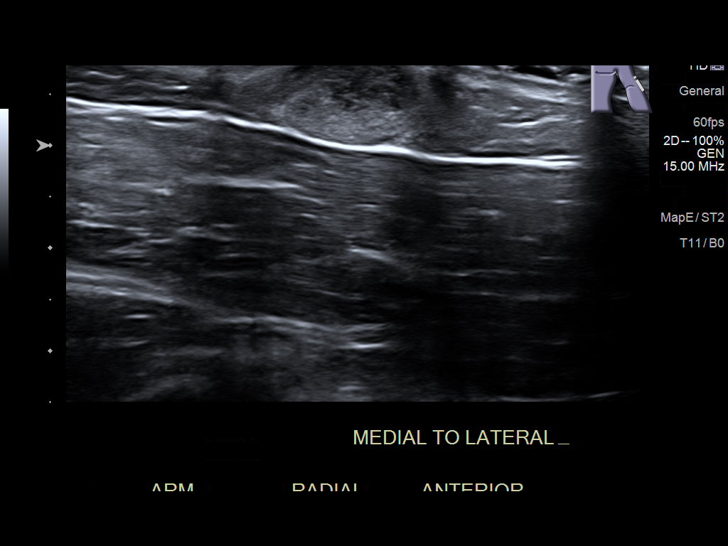
[im 10/21]
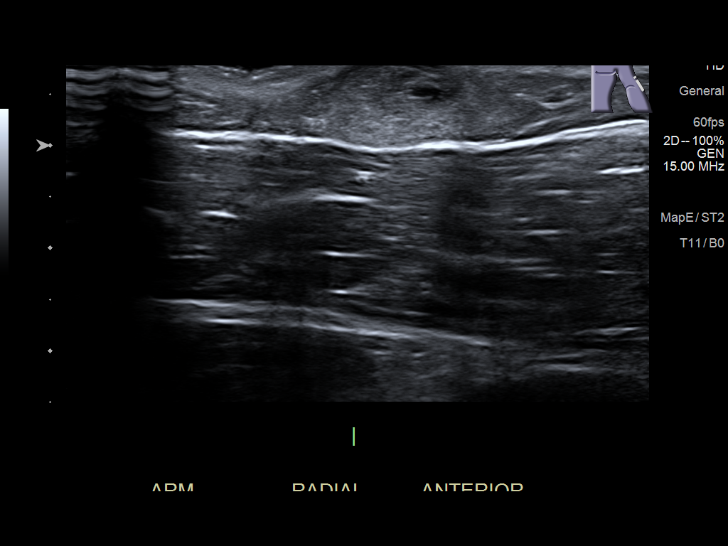
[im 12/21]
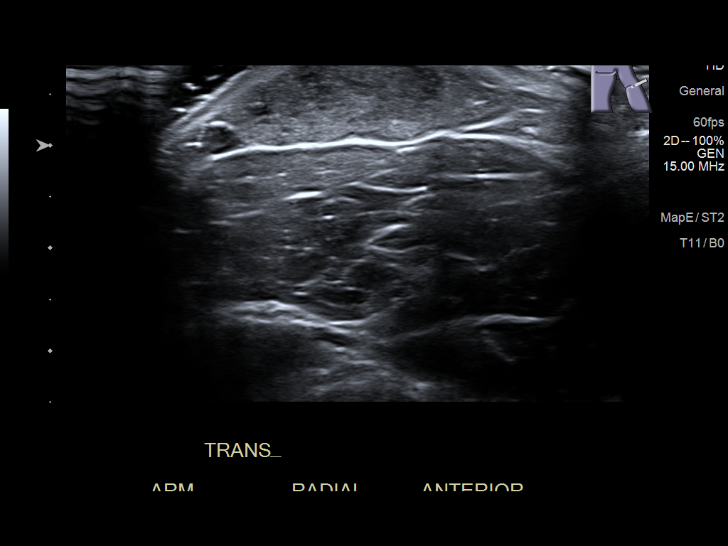
[im 13/21]
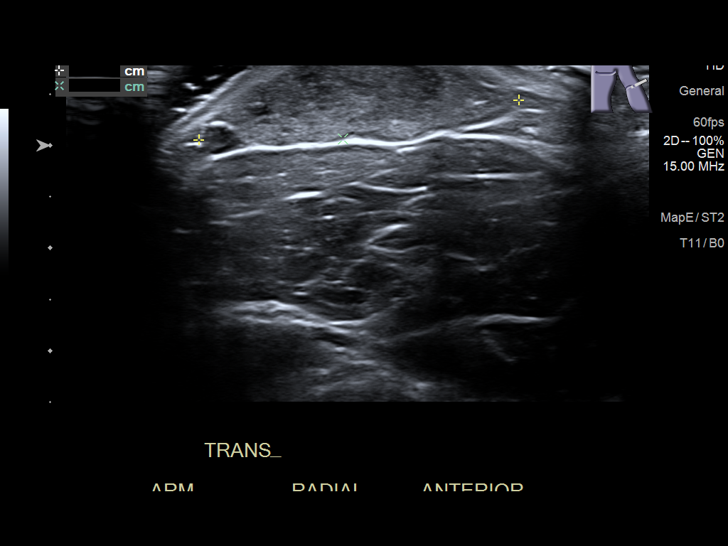
[im 15/21]
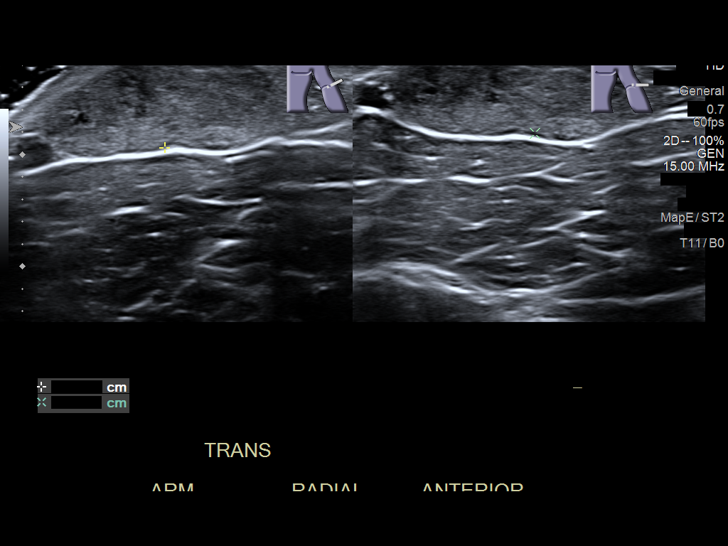
[im 16/21]
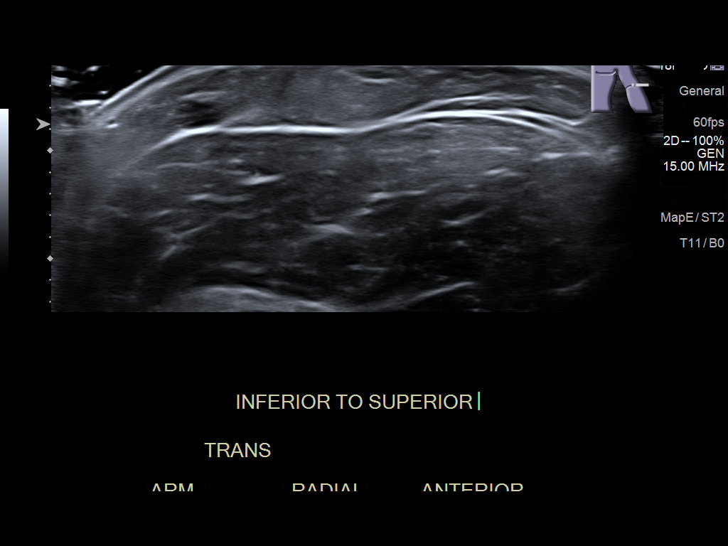
[im 18/21]
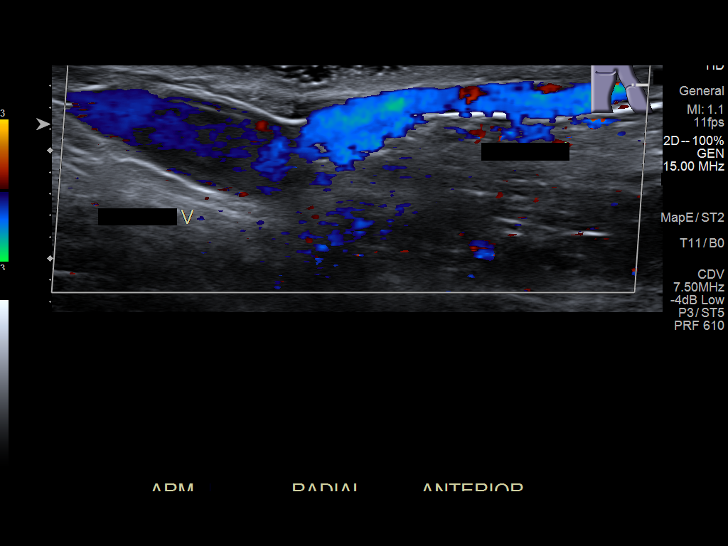
[im 19/21]
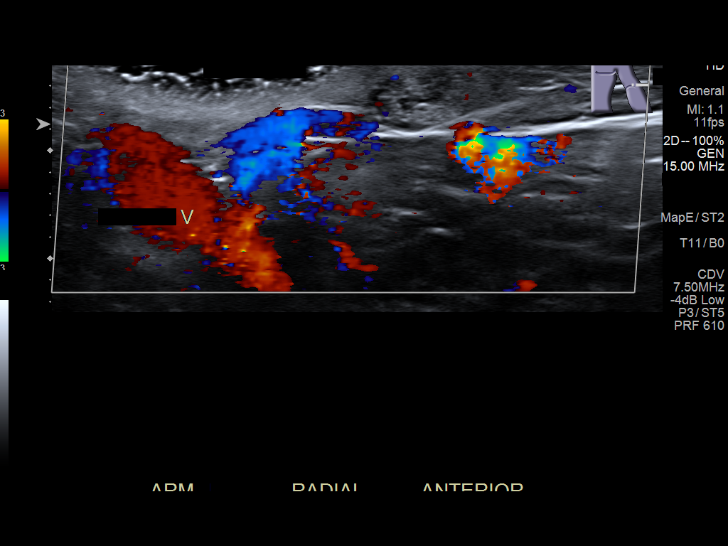
[im 21/21]
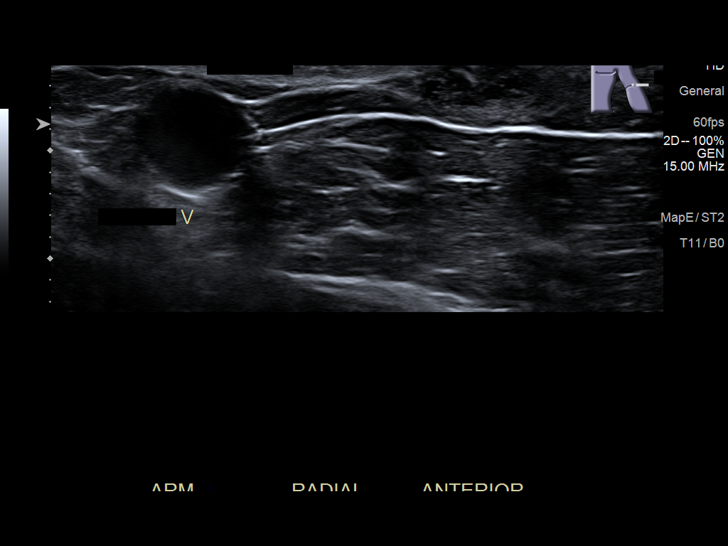

[14 of 21 positions shown; findings below may reference images not displayed]

FINDINGS: Targeted ultrasound was performed of the soft tissues of the left
upper extremity at site of patient's clinical concern. Within the
superficial subcutaneous soft tissues is a lobulated vascular soft
tissue mass with mixed echogenicity measuring approximately 3.1 x
0.9 x 2.7 cm. A branch of the left basilic vein is seen extending
into the mass.
IMPRESSION: Soft tissue mass with prominent internal vascularity within the
superficial soft tissues of the left antecubital fossa measuring up
to 3.1 cm. This may represent a soft tissue hemangioma.

## 2022-11-27 ENCOUNTER — Other Ambulatory Visit: Payer: Self-pay | Admitting: Family Medicine

## 2022-11-27 DIAGNOSIS — G2581 Restless legs syndrome: Secondary | ICD-10-CM

## 2023-01-20 NOTE — Progress Notes (Unsigned)
Name: Ronald Hale   MRN: [id number]    DOB: [birth year]   Date:01/21/2023       Progress Note  Subjective  Chief Complaint  Follow up  HPI   Anxiety/MDD/OCD:  he denies any current symptoms of depression  but still feels  anxious and has OCD. His symptoms were severe in 2022 , affecting his marriage and he was referred to psychiatrist. He saw Dr. Elna Breslow gave him Lexapro and it has helped with anxiety and depression, OCD improved slightly with therapy. He denies side effects. He usually cannot tell a difference but his wife notices when he is off medication . He saw me again Fall 2023  and told me he was having episodes of agitation , we gave him Abilify but he did not noticed an improvement of symptoms so he stopped medication.  Today he came in asking if we could switch his medication to Prozac, he states he has social anxiety and medication works well for one of his friends with similar symptoms. Discussed how to wean off lexapro and start another SSRI, to watch for side effects , it may take 6 weeks for new medication to start working , also discussed buspar to take prn    OCD symptoms at this time includes: cleaning ( shower/washing hands/cleaning house) , he also likes symmetry on his house and work. He does not count or color coordinate. Stable and wife notices improvement with medication  Unchanged  Dyslipidemia: he is not on medication,  risk of heart attack is low   The 10-year ASCVD risk score (Arnett DK, et al., 2019) is: 1.5%   Values used to calculate the score:     Age: 42 years     Sex: Male     Is Non-Hispanic African American: No     Diabetic: No     Tobacco smoker: No     Systolic Blood Pressure: 114 mmHg     Is BP treated: No     HDL Cholesterol: 43 mg/dL     Total Cholesterol: 220 mg/dL    RLS: he had noticed aching sensation and urge to move his legs at night since 2023  symptoms improved when he  stood up and walked around but it was taking him over 30 minutes  to be able to fall asleep due to leg problems. Labs showed low B12, advised to continue supplementation. 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 Discussed PT   Patient Active Problem List   Diagnosis Date Noted   Nephrolithiasis 05/02/2019   Varicocele 04/21/2017   Venous insufficiency 03/13/2017   Varicose veins of leg with pain, right 03/13/2017   Insomnia 06/12/2015   GAD (generalized anxiety disorder) 05/15/2015   Major depression in remission (HCC) 05/15/2015   H. pylori infection 04/12/2015   Obsessive-compulsive disorder 03/23/2015   Perennial allergic rhinitis with seasonal variation 08/23/2008    Past Surgical History:  Procedure Laterality Date   ENDOVENOUS ABLATION SAPHENOUS VEIN W/ LASER Right 09/27/2017   Dr. Thora Lance   NASAL SEPTUM SURGERY      Family History  Problem Relation Age of Onset   Mental illness Neg Hx     Social History   Tobacco Use   Smoking status: Never  Passive exposure: Never   Smokeless tobacco: Never  Substance Use Topics   Alcohol use: No    Alcohol/week: 0.0 standard drinks of alcohol     Current Outpatient Medications:    Cyanocobalamin (B-12) 1000 MCG SUBL, Place 1 tablet under the tongue daily., Disp: 30 tablet, Rfl: 0   FLUoxetine (PROZAC) 20 MG tablet, Take 1 tablet (20 mg total) by mouth daily., Disp: 90 tablet, Rfl: 0   rOPINIRole (REQUIP) 0.5 MG tablet, Take 1 tablet (0.5 mg total) by mouth at bedtime., Disp: 90 tablet, Rfl: 1  Allergies  Allergen Reactions   Poison Oak Extract     I personally reviewed active problem list, medication list, allergies with the patient/caregiver today.   ROS  Ten systems reviewed and is negative except as mentioned in HPI    Objective  Vitals:   01/21/23 1500   BP: 114/72  Pulse: 84  Resp: 14  Temp: 97.9 F (36.6 C)  TempSrc: Oral  SpO2: 98%  Weight: 200 lb 14.4 oz (91.1 kg)  Height: 6\' 2"  (1.88 m)    Body mass index is 25.79 kg/m.  Physical Exam  Constitutional: Patient appears well-developed and well-nourished.  No distress.  HEENT: head atraumatic, normocephalic, pupils equal and reactive to light, neck supple Cardiovascular: Normal rate, regular rhythm and normal heart sounds.  No murmur heard. No BLE edema. Pulmonary/Chest: Effort normal and breath sounds normal. No respiratory distress. Abdominal: Soft.  There is no tenderness. Psychiatric: Patient has a normal mood and affect. behavior is normal. Judgment and thought content normal.   PHQ2/9:    01/21/2023    3:02 PM 10/29/2022    9:45 AM 03/06/2022    9:40 AM 06/27/2021   10:58 AM 02/27/2021    1:53 PM  Depression screen PHQ 2/9  Decreased Interest 0 0 0 0 2  Down, Depressed, Hopeless 0 0 0 0 0  PHQ - 2 Score 0 0 0 0 2  Altered sleeping 0 0 0 0 0  Tired, decreased energy 0 0 0 0 2  Change in appetite 0 0 0 0 0  Feeling bad or failure about yourself  0 0 0 0 0  Trouble concentrating 0 0 0 0 0  Moving slowly or fidgety/restless 0 0 0 0 0  Suicidal thoughts 0 0 0 0 0  PHQ-9 Score 0 0 0 0 4  Difficult doing work/chores  Not difficult at all   Not difficult at all    phq 9 is negative   Fall Risk:    01/21/2023    3:02 PM 10/29/2022    9:45 AM 03/06/2022    9:32 AM 07/05/2021    3:45 PM 06/27/2021   10:54 AM  Fall Risk   Falls in the past year? 0 0 1 0 0  Number falls in past yr:  0 0 0 0  Injury with Fall?  0 0 0 0  Risk for fall due to : No Fall Risks No Fall Risks No Fall Risks;History of fall(s)  No Fall Risks  Follow up Falls prevention discussed Falls prevention discussed;Education provided;Falls evaluation completed Falls prevention discussed;Education provided;Falls evaluation completed  Falls prevention discussed    Functional Status Survey: Is the  patient deaf or have difficulty hearing?: No Does the patient have difficulty seeing, even when wearing glasses/contacts?: No Does the patient have difficulty concentrating, remembering, or making decisions?: No Does the patient have difficulty walking or climbing stairs?: No Does the patient have difficulty dressing or  bathing?: No Does the patient have difficulty doing errands alone such as visiting a doctor's office or shopping?: No    Assessment & Plan  1. Major depression in remission (HCC)  - FLUoxetine (PROZAC) 20 MG tablet; Take 1 tablet (20 mg total) by mouth daily.  Dispense: 90 tablet; Refill: 0  2. Need for Tdap vaccination  - Tdap vaccine greater than or equal to 7yo IM  3. GAD (generalized anxiety disorder)  - FLUoxetine (PROZAC) 20 MG tablet; Take 1 tablet (20 mg total) by mouth daily.  Dispense: 90 tablet; Refill: 0  4. RLS (restless legs syndrome)  - rOPINIRole (REQUIP) 0.5 MG tablet; Take 1 tablet (0.5 mg total) by mouth at bedtime.  Dispense: 90 tablet; Refill: 1  5. Low serum vitamin B12  Continue supplementation  6. Dyslipidemia  On life style modification  7. Obsessive-compulsive disorder, unspecified type  - FLUoxetine (PROZAC) 20 MG tablet; Take 1 tablet (20 mg total) by mouth daily.  Dispense: 90 tablet; Refill: 0

## 2023-01-21 ENCOUNTER — Encounter: Payer: Self-pay | Admitting: Family Medicine

## 2023-01-21 ENCOUNTER — Ambulatory Visit: Payer: 59 | Admitting: Family Medicine

## 2023-01-21 VITALS — BP 114/72 | HR 84 | Temp 97.9°F | Resp 14 | Ht 74.0 in | Wt 200.9 lb

## 2023-01-21 DIAGNOSIS — E538 Deficiency of other specified B group vitamins: Secondary | ICD-10-CM

## 2023-01-21 DIAGNOSIS — E785 Hyperlipidemia, unspecified: Secondary | ICD-10-CM

## 2023-01-21 DIAGNOSIS — F429 Obsessive-compulsive disorder, unspecified: Secondary | ICD-10-CM

## 2023-01-21 DIAGNOSIS — Z23 Encounter for immunization: Secondary | ICD-10-CM

## 2023-01-21 DIAGNOSIS — F325 Major depressive disorder, single episode, in full remission: Secondary | ICD-10-CM | POA: Diagnosis not present

## 2023-01-21 DIAGNOSIS — G2581 Restless legs syndrome: Secondary | ICD-10-CM | POA: Diagnosis not present

## 2023-01-21 DIAGNOSIS — F411 Generalized anxiety disorder: Secondary | ICD-10-CM

## 2023-01-21 MED ORDER — FLUOXETINE HCL 20 MG PO TABS: 20.0000 mg | ORAL_TABLET | Freq: Every day | ORAL | 0 refills | Status: DC

## 2023-01-21 MED ORDER — ROPINIROLE HCL 0.5 MG PO TABS
0.5000 mg | ORAL_TABLET | Freq: Every day | ORAL | 1 refills | Status: DC
Start: 2023-01-21 — End: 2023-07-14

## 2023-01-21 NOTE — Patient Instructions (Signed)
Take one lexapro and half of prozac for one week Second week take half of lexapro and one prozac , stay on that dose for at least 6 weeks.  Let me know how you feel at the end of 6 weeks

## 2023-02-11 ENCOUNTER — Ambulatory Visit: Payer: 59 | Admitting: Family Medicine

## 2023-04-19 ENCOUNTER — Other Ambulatory Visit: Payer: Self-pay | Admitting: Family Medicine

## 2023-04-19 DIAGNOSIS — F429 Obsessive-compulsive disorder, unspecified: Secondary | ICD-10-CM

## 2023-04-19 DIAGNOSIS — F325 Major depressive disorder, single episode, in full remission: Secondary | ICD-10-CM

## 2023-04-19 DIAGNOSIS — F411 Generalized anxiety disorder: Secondary | ICD-10-CM

## 2023-04-21 NOTE — Telephone Encounter (Signed)
 Lvm letting pt know that prescription has been sent to pharmacy however Dr Carlynn Purl is asking that we reschedule his May appt for February.

## 2023-05-13 ENCOUNTER — Other Ambulatory Visit: Payer: Self-pay | Admitting: Family Medicine

## 2023-05-13 DIAGNOSIS — F325 Major depressive disorder, single episode, in full remission: Secondary | ICD-10-CM

## 2023-05-13 DIAGNOSIS — F429 Obsessive-compulsive disorder, unspecified: Secondary | ICD-10-CM

## 2023-05-13 DIAGNOSIS — F411 Generalized anxiety disorder: Secondary | ICD-10-CM

## 2023-05-15 NOTE — Telephone Encounter (Signed)
 Requested Prescriptions  Pending Prescriptions Disp Refills   FLUoxetine  (PROZAC ) 20 MG tablet [Pharmacy Med Name: FLUOXETINE  HCL 20 MG TABLET] 90 tablet 0    Sig: TAKE 1 TABLET BY MOUTH EVERY DAY     Psychiatry:  Antidepressants - SSRI Passed - 05/15/2023  8:38 AM      Passed - Completed PHQ-2 or PHQ-9 in the last 360 days      Passed - Valid encounter within last 6 months    Recent Outpatient Visits           3 months ago Major depression in remission [facility])   [place] [facility] Glenard Mire, MD   6 months ago GAD (generalized anxiety disorder)   [place] [facility] Glenard Mire, MD   1 year ago GAD (generalized anxiety disorder)   [place] [facility] Sowles, Krichna, MD   1 year ago Major depression in remission [facility])    [facility] Sowles, Krichna, MD   2 years ago Obsessive-compulsive disorder, unspecified type   [facility] Glenard Mire, MD       Future Appointments             In 3 months Glenard, Krichna, MD [facility], [facility]

## 2023-07-12 ENCOUNTER — Other Ambulatory Visit: Payer: Self-pay | Admitting: Family Medicine

## 2023-07-12 DIAGNOSIS — G2581 Restless legs syndrome: Secondary | ICD-10-CM

## 2023-08-20 ENCOUNTER — Other Ambulatory Visit: Payer: Self-pay | Admitting: Family Medicine

## 2023-08-20 DIAGNOSIS — G2581 Restless legs syndrome: Secondary | ICD-10-CM

## 2023-08-20 DIAGNOSIS — F411 Generalized anxiety disorder: Secondary | ICD-10-CM

## 2023-08-20 DIAGNOSIS — F325 Major depressive disorder, single episode, in full remission: Secondary | ICD-10-CM

## 2023-08-20 DIAGNOSIS — F429 Obsessive-compulsive disorder, unspecified: Secondary | ICD-10-CM

## 2023-09-02 ENCOUNTER — Ambulatory Visit: Payer: Self-pay | Admitting: Family Medicine

## 2023-09-18 ENCOUNTER — Other Ambulatory Visit: Payer: Self-pay | Admitting: Family Medicine

## 2023-09-18 DIAGNOSIS — G2581 Restless legs syndrome: Secondary | ICD-10-CM

## 2023-09-18 DIAGNOSIS — F325 Major depressive disorder, single episode, in full remission: Secondary | ICD-10-CM

## 2023-09-18 DIAGNOSIS — F411 Generalized anxiety disorder: Secondary | ICD-10-CM

## 2023-09-18 DIAGNOSIS — F429 Obsessive-compulsive disorder, unspecified: Secondary | ICD-10-CM

## 2023-09-21 ENCOUNTER — Other Ambulatory Visit: Payer: Self-pay | Admitting: Family Medicine

## 2023-09-21 DIAGNOSIS — G2581 Restless legs syndrome: Secondary | ICD-10-CM

## 2023-09-21 DIAGNOSIS — F325 Major depressive disorder, single episode, in full remission: Secondary | ICD-10-CM

## 2023-09-21 DIAGNOSIS — F429 Obsessive-compulsive disorder, unspecified: Secondary | ICD-10-CM

## 2023-09-21 DIAGNOSIS — F411 Generalized anxiety disorder: Secondary | ICD-10-CM

## 2023-12-16 ENCOUNTER — Encounter: Payer: Self-pay | Admitting: Family Medicine

## 2023-12-16 ENCOUNTER — Ambulatory Visit: Admitting: Family Medicine

## 2023-12-16 VITALS — BP 106/74 | HR 86 | Resp 16 | Ht 74.0 in | Wt 209.7 lb

## 2023-12-16 DIAGNOSIS — Z79899 Other long term (current) drug therapy: Secondary | ICD-10-CM

## 2023-12-16 DIAGNOSIS — E538 Deficiency of other specified B group vitamins: Secondary | ICD-10-CM

## 2023-12-16 DIAGNOSIS — E785 Hyperlipidemia, unspecified: Secondary | ICD-10-CM

## 2023-12-16 DIAGNOSIS — F325 Major depressive disorder, single episode, in full remission: Secondary | ICD-10-CM

## 2023-12-16 DIAGNOSIS — G2581 Restless legs syndrome: Secondary | ICD-10-CM

## 2023-12-16 DIAGNOSIS — Z131 Encounter for screening for diabetes mellitus: Secondary | ICD-10-CM

## 2023-12-16 DIAGNOSIS — F411 Generalized anxiety disorder: Secondary | ICD-10-CM

## 2023-12-16 DIAGNOSIS — F429 Obsessive-compulsive disorder, unspecified: Secondary | ICD-10-CM | POA: Diagnosis not present

## 2023-12-16 DIAGNOSIS — Z1159 Encounter for screening for other viral diseases: Secondary | ICD-10-CM

## 2023-12-16 MED ORDER — FLUOXETINE HCL 40 MG PO CAPS: 40.0000 mg | ORAL_CAPSULE | Freq: Every day | ORAL | 1 refills | Status: AC

## 2023-12-16 MED ORDER — ROPINIROLE HCL 0.5 MG PO TABS: 0.5000 mg | ORAL_TABLET | Freq: Every day | ORAL | 1 refills | Status: AC

## 2023-12-16 NOTE — Progress Notes (Signed)
 Name: Ronald Hale   MRN: [id number]    DOB: 1980/08/29   Date:12/16/2023       Progress Note  Subjective  Chief Complaint  Chief Complaint  Patient presents with   Medical Management of Chronic Issues   Discussed the use of AI scribe software for clinical note transcription with the patient, who gave verbal consent to proceed.  History of Present Illness Ronald Hale is a 43 year old male with generalized anxiety disorder and major depression who presents for a follow-up visit and medication management.  He reports that he does not feel depressed, but continues to experience significant anxiety, particularly social anxiety. He feels overwhelmed in social situations and prefers to avoid large gatherings, although he occasionally goes out. His anxiety can occur throughout the day, depending on the situation, and intensifies in social settings.  He is currently taking fluoxetine  20 mg daily for his anxiety and depression. His spouse notices changes in his behavior when he is not on medication, although he does not always perceive these changes himself.  He has a history of obsessive-compulsive disorder, which manifests as repetitive behaviors such as excessive cleaning and organizing. He acknowledges that others, including his spouse, notice these behaviors more than he does.  He experiences restless leg symptoms, which affect his sleep. His legs ache, and he needs to move them to get comfortable. He has been taking Requip  for this condition but needs a refill. He also takes over-the-counter sublingual vitamin B12 supplements due to previously low B12 levels, although he has not had recent lab work to check these levels.  He has a history of high cholesterol and is aware of the need to monitor his lipid levels. He does not receive flu shots and is unsure about his hepatitis B immunization status.    Patient Active Problem List   Diagnosis Date Noted   Nephrolithiasis 05/02/2019    Varicocele 04/21/2017   Venous insufficiency 03/13/2017   Varicose veins of leg with pain, right 03/13/2017   Insomnia 06/12/2015   GAD (generalized anxiety disorder) 05/15/2015   Major depression in remission (HCC) 05/15/2015   H. pylori infection 04/12/2015   Obsessive-compulsive disorder 03/23/2015   Perennial allergic rhinitis with seasonal variation 08/23/2008    Past Surgical History:  Procedure Laterality Date   ENDOVENOUS ABLATION SAPHENOUS VEIN W/ LASER Right 09/27/2017   Dr. Lawayne Patience   NASAL SEPTUM SURGERY      Family History  Problem Relation Age of Onset   Mental illness Neg Hx     Social History   Tobacco Use   Smoking status: Never    Passive exposure: Never   Smokeless tobacco: Never  Substance Use Topics   Alcohol use: No    Alcohol/week: 0.0 standard drinks of alcohol     Current Outpatient Medications:    Cyanocobalamin (B-12) 1000 MCG SUBL, Place 1 tablet under the tongue daily., Disp: 30 tablet, Rfl: 0   FLUoxetine  (PROZAC ) 40 MG capsule, Take 1 capsule (40 mg total) by mouth daily., Disp: 90 capsule, Rfl: 1   fluticasone  (FLONASE ) 50 MCG/ACT nasal spray, Place 1 spray into both nostrils as needed for allergies., Disp: , Rfl:    rOPINIRole  (REQUIP ) 0.5 MG tablet, Take 1 tablet (0.5 mg total) by mouth at bedtime., Disp: 90 tablet, Rfl: 1  Allergies  Allergen Reactions   Poison Oak Extract     I personally reviewed active problem list, medication list, allergies, family history with the patient/caregiver today.  ROS  Ten systems reviewed and is negative except as mentioned in HPI    Objective Physical Exam CONSTITUTIONAL: Patient appears well-developed and well-nourished.  No distress. HEENT: Head atraumatic, normocephalic, neck supple. CARDIOVASCULAR: Normal rate, regular rhythm and normal heart sounds.  No murmur heard. No BLE edema. PULMONARY: Effort normal and breath sounds normal. No respiratory distress. ABDOMINAL: There is  no tenderness or distention. MUSCULOSKELETAL: Normal gait. Without gross motor or sensory deficit. PSYCHIATRIC: Patient has a normal mood and affect. behavior is normal. Judgment and thought content normal.  Vitals:   12/16/23 1002  BP: 106/74  Pulse: 86  Resp: 16  SpO2: 97%  Weight: 209 lb 11.2 oz (95.1 kg)  Height: 6' 2 (1.88 m)    Body mass index is 26.92 kg/m.   PHQ2/9:    12/16/2023   10:01 AM 01/21/2023    3:02 PM 10/29/2022    9:45 AM 03/06/2022    9:40 AM 06/27/2021   10:58 AM  Depression screen PHQ 2/9  Decreased Interest 0 0 0 0 0  Down, Depressed, Hopeless 0 0 0 0 0  PHQ - 2 Score 0 0 0 0 0  Altered sleeping 0 0 0 0 0  Tired, decreased energy 0 0 0 0 0  Change in appetite 0 0 0 0 0  Feeling bad or failure about yourself  0 0 0 0 0  Trouble concentrating 0 0 0 0 0  Moving slowly or fidgety/restless 0 0 0 0 0  Suicidal thoughts 0 0 0 0 0  PHQ-9 Score 0 0 0 0 0  Difficult doing work/chores Not difficult at all  Not difficult at all      phq 9 is negative     12/16/2023   10:01 AM 01/21/2023    3:02 PM 10/29/2022    9:50 AM 03/06/2022    9:41 AM  GAD 7 : Generalized Anxiety Score  Nervous, Anxious, on Edge 0 0 0 2  Control/stop worrying 0 0 0 0  Worry too much - different things 0 0 0 0  Trouble relaxing 0 0 0 1  Restless 0 0 0 3  Easily annoyed or irritable 0 0 0 3  Afraid - awful might happen 0 0 0 0  Total GAD 7 Score 0 0 0 9  Anxiety Difficulty Not difficult at all  Not difficult at all Not difficult at all     Fall Risk:    12/16/2023    9:57 AM 01/21/2023    3:02 PM 10/29/2022    9:45 AM 03/06/2022    9:32 AM 07/05/2021    3:45 PM  Fall Risk   Falls in the past year? 0 0 0 1 0  Number falls in past yr: 0  0 0 0  Injury with Fall? 0  0 0 0  Risk for fall due to : No Fall Risks No Fall Risks No Fall Risks No Fall Risks;History of fall(s)   Follow up Falls evaluation completed Falls prevention discussed Falls prevention discussed;Education  provided;Falls evaluation completed Falls prevention discussed;Education provided;Falls evaluation completed       Data saved with a previous flowsheet row definition      Assessment & Plan Generalized anxiety disorder with obsessive-compulsive features Generalized anxiety disorder with obsessive-compulsive features persists. Current fluoxetine  20 mg is insufficient. Considered increasing fluoxetine  to 40 mg. Discussed potential side effect of erectile dysfunction. - Increase fluoxetine  to 40 mg daily. - Monitor for side effects, particularly erectile dysfunction. -  Consider alternative dosing of fluoxetine  (20 mg + 10 mg) if 40 mg is not tolerated. - Follow up in 3 months to assess response to medication adjustment. - Provide 47-month supply of medication.  Major depressive disorder in full remission Current treatment with fluoxetine .  Restless legs syndrome Restless legs syndrome with symptoms affecting sleep. Symptoms may be exacerbated by low vitamin B12 levels. - Resume Requip  for restless legs syndrome. - Check vitamin B12 levels to rule out deficiency as a contributing factor.  Vitamin B12 deficiency Vitamin B12 deficiency managed with sublingual supplements. Need to reassess B12 levels to ensure adequacy of supplementation. - Check vitamin B12 levels. - Consider B12 injections if sublingual supplementation is inadequate.  Hyperlipidemia Hyperlipidemia with previously high cholesterol levels. Need to reassess lipid panel. - Check lipid panel.  General Health Maintenance Discussed general health maintenance including immunizations and screenings. Hepatitis B immunization status unknown. Tetanus immunization up to date. Declined flu shot. - Check hepatitis B immunization status. - Perform blood work today to assess various health parameters.

## 2024-06-15 ENCOUNTER — Encounter: Admitting: Family Medicine
